# Patient Record
Sex: Female | Born: 1970 | Race: White | Hispanic: Yes | Marital: Married | Smoking: Never smoker
Health system: Southern US, Community
[De-identification: ages and names within clinical notes are randomized; demographics above are authoritative.]

## PROBLEM LIST (undated history)

## (undated) ENCOUNTER — Emergency Department (HOSPITAL_COMMUNITY): Admission: EM | Payer: Self-pay | Source: Home / Self Care

## (undated) DIAGNOSIS — I10 Essential (primary) hypertension: Secondary | ICD-10-CM

## (undated) DIAGNOSIS — K219 Gastro-esophageal reflux disease without esophagitis: Secondary | ICD-10-CM

## (undated) DIAGNOSIS — E7800 Pure hypercholesterolemia, unspecified: Secondary | ICD-10-CM

---

## 2002-01-17 ENCOUNTER — Inpatient Hospital Stay (HOSPITAL_COMMUNITY): Admission: AD | Admit: 2002-01-17 | Discharge: 2002-01-17 | Payer: Self-pay | Admitting: Obstetrics and Gynecology

## 2002-01-27 ENCOUNTER — Encounter: Admission: RE | Admit: 2002-01-27 | Discharge: 2002-01-27 | Payer: Self-pay | Admitting: Family Medicine

## 2003-04-06 ENCOUNTER — Other Ambulatory Visit: Admission: RE | Admit: 2003-04-06 | Discharge: 2003-04-06 | Payer: Self-pay | Admitting: *Deleted

## 2003-10-19 ENCOUNTER — Inpatient Hospital Stay (HOSPITAL_COMMUNITY): Admission: AD | Admit: 2003-10-19 | Discharge: 2003-10-23 | Payer: Self-pay | Admitting: *Deleted

## 2003-10-20 ENCOUNTER — Encounter (INDEPENDENT_AMBULATORY_CARE_PROVIDER_SITE_OTHER): Payer: Self-pay | Admitting: *Deleted

## 2003-12-22 ENCOUNTER — Other Ambulatory Visit: Admission: RE | Admit: 2003-12-22 | Discharge: 2003-12-22 | Payer: Self-pay | Admitting: *Deleted

## 2006-11-16 ENCOUNTER — Emergency Department (HOSPITAL_COMMUNITY): Admission: EM | Admit: 2006-11-16 | Discharge: 2006-11-16 | Payer: Self-pay | Admitting: Emergency Medicine

## 2009-03-16 ENCOUNTER — Other Ambulatory Visit: Admission: RE | Admit: 2009-03-16 | Discharge: 2009-03-16 | Payer: Self-pay | Admitting: Gynecology

## 2009-03-16 ENCOUNTER — Ambulatory Visit: Payer: Self-pay | Admitting: Gynecology

## 2009-03-16 ENCOUNTER — Encounter: Payer: Self-pay | Admitting: Gynecology

## 2009-03-18 ENCOUNTER — Ambulatory Visit: Payer: Self-pay | Admitting: Gynecology

## 2010-09-24 ENCOUNTER — Emergency Department (HOSPITAL_COMMUNITY)
Admission: EM | Admit: 2010-09-24 | Discharge: 2010-09-24 | Disposition: A | Discharge status: Home / Self Care | Payer: Self-pay | Attending: Emergency Medicine | Admitting: Emergency Medicine

## 2010-09-24 ENCOUNTER — Emergency Department (HOSPITAL_COMMUNITY): Payer: Self-pay

## 2010-09-24 DIAGNOSIS — R059 Cough, unspecified: Secondary | ICD-10-CM | POA: Insufficient documentation

## 2010-09-24 DIAGNOSIS — IMO0001 Reserved for inherently not codable concepts without codable children: Secondary | ICD-10-CM | POA: Insufficient documentation

## 2010-09-24 DIAGNOSIS — J3489 Other specified disorders of nose and nasal sinuses: Secondary | ICD-10-CM | POA: Insufficient documentation

## 2010-09-24 DIAGNOSIS — J069 Acute upper respiratory infection, unspecified: Secondary | ICD-10-CM | POA: Insufficient documentation

## 2010-09-24 DIAGNOSIS — R509 Fever, unspecified: Secondary | ICD-10-CM | POA: Insufficient documentation

## 2010-09-24 DIAGNOSIS — R05 Cough: Secondary | ICD-10-CM | POA: Insufficient documentation

## 2011-01-06 NOTE — Discharge Summary (Signed)
Jill Conrad, Jill Conrad                     ACCOUNT NO.:  [id number]   MEDICAL RECORD NO.:  [id number]                   PATIENT TYPE:  INP   LOCATION:  9135                                 FACILITY:  [facility]   PHYSICIAN:  Gerri Spore B. Earlene Plater, M.D.               DATE OF BIRTH:  June 05, 1971   DATE OF ADMISSION:  10/19/2003  DATE OF DISCHARGE:  10/23/2003                                 DISCHARGE SUMMARY   ADMISSION DIAGNOSES:  1. Forty-week intrauterine pregnancy.  2. Mild preeclampsia.  3. Gestational diabetes.   DISCHARGE DIAGNOSES:  1. Forty-week intrauterine pregnancy.  2. Mild preeclampsia.  3. Gestational diabetes.   PROCEDURES:  1. Admission for induction of labor.  2. Primary low transverse cesarean section for intermittently-repetitive     late decelerations and meconium-stained fluid.   HISTORY OF PRESENT ILLNESS:  A 40 year old Hispanic female gravida 1 para 0  at 40+ weeks noted to have blood pressures in the 140s over 90s range the  week prior to admission.  A 24-hour urine on the day prior to admission 350  mg.  Given her gestational age the patient is admitted for induction of  labor.   HOSPITAL COURSE:  The patient was admitted and cervix ripened with Cervidil  overnight and Pitocin started in the morning.  She was given penicillin for  group B strep prophylaxis.  Of note, she was found to have a slightly  elevated glucose on preeclampsia labs and these were followed in the 120  range (her Glucola had been normal).  However, given the persistently-  elevated blood sugars an insulin drip was started.  She only required about  1 unit/hour to control glucose.   The patient's membranes were ruptured and she was about 1 cm, noted to have  intermittent late decelerations.  IUPC was inserted and the resting tone was  noted to be elevated despite proper functioning of the IUPC and proper  zeroing.  The uterus felt tight between contractions and therefore the  Pitocin was  cut in half.   Subsequently the uterine contractions were very inadequate but resting tone  was normal and was noted again to have intermittent late decelerations  without cervical change.  Given the history of elevated resting tone,  intermittent late decelerations, and remoteness from delivery I recommended  cesarean section as the possibility of chorioamnionitis or abruption was  present.  The patient was in agreement.   She was subsequently delivered via primary low transverse cesarean section a  viable female, Apgars 7 and 9, 7 pounds 5 ounces, with cord pH 7.27.  Placenta  appeared normal and was submitted to pathology.   Postoperatively the patient rapidly regained her ability to ambulate, void,  and tolerate a regular diet.  Her blood pressure was normalized after  delivery and she was discharged home on postoperative day #3 in satisfactory  condition.   DISCHARGE INSTRUCTIONS:  Standard preprinted instructions given prior to  dismissal.   DISCHARGE MEDICATIONS:  1. Tylox one to two p.o. q.4-6h. p.r.n. pain.  2. Motrin 600 mg p.o. q.8h. p.r.n. pain.   FOLLOW-UP:  [facility], Dr. Earlene Plater, in 4 weeks.   Of note, the final pathology on the placenta showed acute chorioamnionitis  and meconium staining of the fetal membranes and numerous subchorionic and  intervillous thrombi and partially-torn umbilical cord at the insertion site  with associated hematoma.   DISPOSITION AT DISCHARGE:  Satisfactory.                                               Gerri Spore B. Earlene Plater, M.D.    WBD/MEDQ  D:  11/16/2003  T:  11/16/2003  Job:  [id number]

## 2011-01-06 NOTE — Op Note (Signed)
Jill Conrad, Jill Conrad                     ACCOUNT NO.:  [id number]   MEDICAL RECORD NO.:  [id number]                   PATIENT TYPE:  INP   LOCATION:  9164                                 [facility]ILITY[facility]  [facility]   PHYSICIAN:  Gerri Spore B. Earlene Plater, M.D.               DATE OF BIRTH:  01/16/1971   DATE OF PROCEDURE:  10/20/2003  DATE OF DISCHARGE:                                 OPERATIVE REPORT   PREOPERATIVE DIAGNOSES:  1. Mild preeclampsia.  2. Gestational diabetes.  3. Intermittently repetitive late decelerations.  4. Meconium-stained fluid.   POSTOPERATIVE DIAGNOSES:  1. Mild preeclampsia.  2. Gestational diabetes.  3. Intermittently repetitive late decelerations.  4. Meconium-stained fluid.   PROCEDURE:  Primary low transverse cesarean section.   SURGEON:  Chester Holstein. Earlene Plater, M.D.   ANESTHESIA:  Spinal.   FINDINGS:  A viable female infant, Apgars 7 and 9, weight 7 pounds 5 ounces.  Normal-appearing uterus, tubes, and ovaries.  Arterial cord pH 7.27.   ESTIMATED BLOOD LOSS:  750.   FLUIDS REPLACED:  2000.   URINE OUTPUT:  300.   COMPLICATIONS:  None.   INDICATIONS:  The patient was being induced for the above issues.  She was 1  cm, ruptured, with an IUPC in place.  She had an elevated resting tone and  intermittent late decelerations, concerning for possible early abruption.  When Pitocin was decreased, the baseline improved; however, there were  minimally discernible contractions over baseline at that point.  Given that  at higher doses of Pitocin she had an elevated resting tone and at lower  doses inadequate contractions and intermittent late decelerations remote  from delivery, I recommended we proceed with a cesarean section.  Operative  risks were discussed in Spanish to the patient and her husband, including  infection, bleeding, damage to bowel or bladder or surrounding organs, all  questions answered.  The patient wished to proceed.   DESCRIPTION OF PROCEDURE:  The  patient was taken to the operating room and  spinal anesthesia obtained.  She was placed in the supine position with a  leftward tilt, patient in standard fashion, and a Foley catheter inserted in  the bladder.  A Pfannenstiel incision was made.  The fascia was divided  sharply.  The posterior sheath of the peritoneum was entered sharply.  A  bladder blade inserted and bladder flap created with sharp and blunt  technique.   The uterine incision made in low transverse fashion with a knife.  Slightly  meconium-tinged fluid noted at entry into the cavity.  The incision was  extended laterally with bandage scissors.  The infant's head was delivered  through the incision without difficulty and found to be left occiput  transverse.  The nose and mouth were suctioned with the DeLee wall suction,  the remainder of the infant delivered without difficulty, the cord clamped  and cut, and the infant handed off to the waiting pediatricians.  The  patient was already receiving penicillin for group B strep prophylaxis;  therefore, no additional antibiotics were given.  Arterial cord pH was  obtained.   The placenta was removed manually and no evidence clinically of abruption  was seen.  There was meconium-stained fluid.  The membranes were meconium-  stained.  The placenta was submitted to pathology.   The uterus was exteriorized and cleared of all clots and debris.  The  uterine incision was inspected and was free of extension.  The right tube  and ovary appeared normal.  The left tube and ovary were somewhat obscured  by adhesions, and it was unclear if there was actually a left tube or ovary  present.  The patient does not report any history of previous surgery.   The uterus was closed in a running locked fashion with 0 chromic.  A second  imbricating layer was placed of the same suture.  Hemostasis obtained.  The  uterus was returned to the abdomen and the pelvis irrigated.  The uterine   incision was inspected and was hemostatic.  The bladder flap was hemostatic,  as was the subfascial space.   The fascia was closed in a running stitch of 0 Vicryl.  The subcutaneous  tissue was irrigated and was hemostatic.  It was reapproximated with  interrupted plain 0 suture.  The skin was closed with staples.   The patient tolerated the procedure well.  There were no complications.  She  was taken to the recovery room awake, alert, and in stable condition, all  counts correct per the operating room staff.  The pediatrician was made  aware of the patient's elevated blood sugars, which were treated with an  insulin drip during labor.                                               Gerri Spore B. Earlene Plater, M.D.    WBD/MEDQ  D:  10/20/2003  T:  10/20/2003  Job:  [id number]

## 2014-05-08 ENCOUNTER — Emergency Department (HOSPITAL_COMMUNITY): Payer: Self-pay

## 2014-05-08 ENCOUNTER — Emergency Department (HOSPITAL_COMMUNITY)
Admission: EM | Admit: 2014-05-08 | Discharge: 2014-05-08 | Disposition: A | Discharge status: Home / Self Care | Payer: Self-pay | Attending: Emergency Medicine | Admitting: Emergency Medicine

## 2014-05-08 ENCOUNTER — Encounter (HOSPITAL_COMMUNITY): Payer: Self-pay | Admitting: Emergency Medicine

## 2014-05-08 DIAGNOSIS — M25562 Pain in left knee: Secondary | ICD-10-CM

## 2014-05-08 DIAGNOSIS — G8911 Acute pain due to trauma: Secondary | ICD-10-CM | POA: Insufficient documentation

## 2014-05-08 DIAGNOSIS — Z79899 Other long term (current) drug therapy: Secondary | ICD-10-CM | POA: Insufficient documentation

## 2014-05-08 DIAGNOSIS — M25569 Pain in unspecified knee: Secondary | ICD-10-CM | POA: Insufficient documentation

## 2014-05-08 MED ORDER — ACETAMINOPHEN 325 MG PO TABS
650.0000 mg | ORAL_TABLET | Freq: Four times a day (QID) | ORAL | Status: DC | PRN
Start: 2014-05-08 — End: 2020-11-04

## 2014-05-08 MED ORDER — HYDROCODONE-ACETAMINOPHEN 5-325 MG PO TABS
1.0000 | ORAL_TABLET | Freq: Once | ORAL | Status: AC
  Administered 2014-05-08: 1 via ORAL
  Filled 2014-05-08: qty 1

## 2014-05-08 MED ORDER — MELOXICAM 15 MG PO TABS: 15.0000 mg | ORAL_TABLET | Freq: Every day | ORAL | Status: DC

## 2014-05-08 NOTE — ED Provider Notes (Signed)
CSN: [id number]     Arrival date & time 05/08/14  0804 History   First MD Initiated Contact with Patient 05/08/14 0825     Chief Complaint  Patient presents with  . Leg Pain     (Consider location/radiation/quality/duration/timing/severity/associated sxs/prior Treatment) HPI Comments: Patient reports left knee pain x1 month.  Pain began after stepping in a hole causing her to twist and fall on her left knee.  She had swelling of the knee and immediate afterwards, and was evaluated by a physician who started [facility] and recommended rest.  She has continued to take [facility] for the last month with minimal relief; swelling has improved but not resolved.  She denies any previous knee injuries or surgeries.  Pain is greatest on the medial aspect and made worse with activity/walking and twisting.  She denies any locking or giving out.  She's not taking any blood thinning medication.   Patient is a 43 y.o. female presenting with leg pain.  Leg Pain Location:  Knee Time since incident:  1 month Injury: yes   Mechanism of injury: fall   Fall:    Fall occurred:  Walking   Impact surface:  Designer, fashion/clothing of impact:  Knees   Entrapped after fall: no   Knee location:  L knee Pain details:    Quality:  Aching and sharp   Radiates to:  Does not radiate   Severity:  Severe   Duration:  1 month   Timing:  Intermittent   Progression:  Unchanged Dislocation: no   Prior injury to area:  No Relieved by:  Nothing Worsened by:  Activity, flexion, rotation and bearing weight Ineffective treatments:  NSAIDs and rest Associated symptoms: decreased ROM, stiffness and swelling   Associated symptoms: no back pain, no fever and no muscle weakness   Risk factors: no concern for non-accidental trauma and no frequent fractures     History reviewed. No pertinent past medical history. History reviewed. No pertinent past surgical history. No family history on file. History  Substance Use Topics  . Smoking status:  Never Smoker   . Smokeless tobacco: Not on file  . Alcohol Use: No   OB History   Grav Para Term Preterm Abortions TAB SAB Ect Mult Living                 Review of Systems  Constitutional: Negative for fever.  Musculoskeletal: Positive for stiffness. Negative for back pain.      Allergies  Review of patient's allergies indicates no known allergies.  Home Medications   Prior to Admission medications   Medication Sig Start Date End Date Taking? Authorizing Provider  Camphor-Menthol-Methyl Sal (MUSCLE RUB) 11-28-28 % CREA Apply 1 application topically 2 (two) times daily as needed (Knee pain).   Yes Historical Provider, MD  lisinopril-hydrochlorothiazide (PRINZIDE,ZESTORETIC) 20-25 MG per tablet Take 1 tablet by mouth daily.   Yes Historical Provider, MD  acetaminophen (TYLENOL) 325 MG tablet Take 2 tablets (650 mg total) by mouth every 6 (six) hours as needed for moderate pain. 05/08/14   Jamal Collin, MD  meloxicam (MOBIC) 15 MG tablet Take 1 tablet (15 mg total) by mouth daily. 05/08/14   Jamal Collin, MD   BP 109/55  Pulse 66  Temp(Src) 98.1 F (36.7 C) (Oral)  Resp 16  SpO2 100% Physical Exam  Constitutional: She appears well-developed. No distress.  Cardiovascular: Normal rate and regular rhythm.   Pulmonary/Chest: Effort normal and breath sounds normal.  Musculoskeletal:  Knee: Inspection: Mild swelling with no erythema or obvious bony abnormalities.  Palpation: Medial joint line tenderness; no warmth or patellar tenderness ROM: Limited; flexion 90; distention 170 Ligaments: Laxity noted valgus stress testing;  consistent endpoints with  ACL, PCL, LCL testing. Positive Mcmurray's, Apley's, and Thessalonian tests. Non painful patellar compression. Patellar glide without crepitus. Patellar and quadriceps tendons unremarkable. Hamstring and quadriceps strength 4/5 -limited by pain     ED Course  Procedures (including critical care time) Labs Review Labs  Reviewed - No data to display  Imaging Review Dg Knee Complete 4 Views Left  05/08/2014   CLINICAL DATA:  Left knee pain  EXAM: LEFT KNEE - COMPLETE 4+ VIEW  COMPARISON:  None.  FINDINGS: Very mild degenerative changes are noted. A small joint effusion is seen. No acute fracture dislocation is noted.  IMPRESSION: Mild degenerative change.  No acute abnormality is noted.   Electronically Signed   By: Alcide Clever M.D.   On: 05/08/2014 09:13     EKG Interpretation None      MDM   Final diagnoses:  Knee pain, left   Patient presents with left knee pain for one month after fall involving twisting left knee.  Swelling has improved, but pain persists despite Modic x1 month.  Physical exam notable for mild left knee effusion positive valgus stress testing, and positive Apley's, McMurray and Thessaly.  Suspect medial meniscus injury.  Remaining ligaments are intact.  Knee brace given, advised icing knee after activity.  Patient advised to followup with sports medicine for additional testing, and consideration of physical therapy or surgery. Increased Mobic to 15 mg qd.    Jamal Collin, MD 05/08/14 1017

## 2014-05-08 NOTE — ED Notes (Signed)
Brought pt back to room; pt instructed to get undressed and to place on gown; Cyndi, RN aware of pt

## 2014-05-08 NOTE — Discharge Instructions (Signed)
Hielo en la rodilla durante 15 -20 minutos despus de la [employer] Mobic a 15 mg una vez al [employer] Tylenol 650 mg hasta cuatro veces al da para el dolor adicional  Dolor en la rodilla (Knee Pain) La rodilla es la articulacin compleja entre el muslo y la parte inferior de la pierna. En esta articulacin hay huesos, tendones, ligamentos y TEFL teacher. Los huesos que forman la rodilla son:  El fmur en el muslo.  La tibia y el peron en la pierna.  La rtula montada en la ranura de la parte inferior del muslo. CAUSAS El dolor de rodilla es una causa frecuente de [place] y puede tener varias causas. Algunas son:  Lesiones como:  Ruptura de ligamento o lesin en el tendn.  Esguince del cartlago  Enfermedades como:  Gota.  Artritis.  Infecciones.  Uso excesivo, demasiado entrenamiento o mucha actividad fsica. El dolor de rodilla puede ser leve o intenso. Puede acompaar una lesin debilitante. Los problemas leves con frecuencia responden bien a tratamientos caseros o se mejoran por s mismas. Las lesiones ms graves pueden requerir la intervencin del mdico y [place]. SNTOMAS La rodilla es una articulacin compleja. Los sntomas pueden variar ampliamente Algunos son:  Dolor con el movimiento o al soportar peso.  Hinchazn y Engineer, mining.  Torsin de la rodilla.  Imposibilidad para estirar la rodilla.  La rodilla se traba y no puede enderezarla.  Siente calor y se observa enrojecimiento con dolor y [employer].  Deformidad o dislocacin de la rtula. DIAGNSTICO Determinar cual es el problema puede ser bastante simple, como cuando hay una lesin. Tambin puede ser [employer] debido a la complejidad de la rodilla. Las pruebas para Education officer, environmental un diagnstico son:  Marily Memos y examen fsico por parte del mdico.  Radiografas para descartar otros problemas. Las radiografas no mostrarn la ruptura del TEFL teacher. Algunas lesiones en la rodilla pueden  diagnosticarse del siguiente modo:  La artroscopia es una tcnica quirrgica por la que una pequea cmara de vdeo se inserta en pequeas incisiones que se hacen a los lados de la rodilla. Este procedimiento se [place] para examinar y Therapist, sports los problemas de la articulacin interna de la rodilla. Se utilizan pequeos instrumentos para reparar el cartlago roto (meniscos).  La artrografa es una tcnica radiolgica. Se inyecta un lquido de contraste en la articulacin de la rodilla. Las estructuras internas de la articulacin de la rodilla se hacen visibles en una pelcula de rayos X.  Las imgenes por resonancia magntica son un procedimiento en el que los campos magnticos y una computadora producen imgenes en dos o tres dimensiones del interior de la rodilla. La ruptura del cartlago es visible con esta tcnica. La resonancia magntica ha reemplazado a la artrografa en el diagnstico de la ruptura del cartlago de la rodilla.  Anlisis de [place].  Examen del lquido que lubrica la articulacin de la rodilla (lquido sinovial). Se realiza tomando [place] con [place]. TRATAMIENTO El tratamiento de los problemas de la rodilla depende fundamentalmente de la causa. Algunos de estos tratamientos son:  Segn sea la lesin, un yeso o entablillado, ciruga o fisioterapia.  Permtase el tiempo adecuado de recuperacin. No use demasiado su extremidad lesionada. Si siente dolor durante los ejercicios de rutina, suspndalos. Hgalos ms lentos o realice menos repeticiones.  En el caso de actividades repetitivas como andar en bicicleta o correr, mantenga la fuerza y Neomia Dear buena nutricin.  Alterne los grupos musculares. Por ejemplo, si levanta pesas, trabaje la  parte superior del cuerpo Civil engineer, contracting, y la parte inferior al da siguiente.  Ni los msculos firmes ni los dbiles proporcionan un sostn adecuado a la rodilla. Los msculos no absorben el estrs que se ejerce sobre la articulacin de  la rodilla. Mantenga fuertes los msculos que rodean a la rodilla.  Cudese de los problemas mecnicos:  Si tiene pie plano, los zapatos ortopdicos o especiales pueden ayudar. Comunquese con el profesional que lo asiste si necesita ayuda adicional.  Los soporte de arco con bordes en la zona interna o interna del taco pueden ayudar. Cambian la presin del lado de la rodilla ms comprometido por la osteoartritis.  Podrn colocarle una ortesis de rodilla para aliviar la presin en la zona ms artrtica de la rodilla.  Si el profesional le ha prescripto muletas, ortesis, un vendaje o hielo, hgalo segn las indicaciones. El acrnimo para este tratamiento es PRICE. Significa proteccin, reposo, hielo, compresin y elevacin.  Los antiinflamatorios no esteroides, pueden ayudar a Engineer, materials. Pero si se toman inmediatamente luego de la lesin, podran aumentar la hinchazn. Tome los corticoides luego de Clinical cytogeneticist. Suspndalos si tiene problemas estomacales. No los tome si tiene una historia de Occupational hygienist, Engineer, mining en el estmago o hemorragia intestinal. No lo tome sin la aprobacin del profesional que la asiste si tiene problemas de retencin de lquidos, insuficencia cardaca o problemas renales.  En los casos crnicos, la fisioterapia puede ser de [place].  La glucosamina y el condroitin son suplementos dietarios de Sales promotion account executive. Ambos pueden Engineer, materials de la osteoartritis de la rodilla. Estos medicamentos son diferentes de los antiinflamatorios habituales. La glucosamina puede disminuir el porcentaje de destruccin del cartlago.  Las inyecciones de corticoides en la articulacin de la rodilla reducen los sntomas de un brote de artritis. Ofrecen alivio que dura algunos meses. Hay que esperar algunos meses entre la aplicacin de inyecciones. Las inyecciones tiene un pequeo riesgo de infeccin, retencin de lquidos y Engineer, structural de los niveles de Production assistant, radio.  El cido hialurnico inyectado en  las articulaciones lesionadas puede aliviar el dolor y proporciona lubricacin. Estas inyecciones funcionan bien reduciendo la inflamacin. Una serie de inyecciones puede proporcionar alivio durante seis meses.  Analgsicos locales. Aplicar ciertos ungentos sobre la piel puede ayudar a Engineer, materials y la rigidez de la osteoartritis. Consulte con el farmacutico, si es necesario. Muchos medicamentos de venta libre estn aprobados para el alivio temporario del dolor artrtico.  En algunos pases los mdicos prescriben antiinflamatorios no esteroides para el alivio de los trastornos crnicos como la artritis y la tendinitis. Un estudio del tratamiento con antiinflamatorios no esteroides aplicados en crema, demostr que funcionaban bien, as como administrados por va oral, pero sin el peligro de los [place]. PREVENCIN  Mantenga un peso normal. Los kilos de ms agregan tensin a las articulaciones.  Mantngase fuerte y gil. Los msculos dbiles son Neomia Dear causa frecuente de lesiones en la rodilla. La elongacin es importante. Incluya ejercicios de flexibilidad en sus rutinas.  Practique actividad fsica con inteligencia. Si sufre osteoartritis, dolor crnico en la rodilla o lesiones recurrentes, podr ser necesario que modifique el modo en que se ejercita. No significa que deba volverse inactivo. Si le duelen las rodillas despus de correr o jugar basketball, considere la prctica de la natacin, ejercicios aerbicos en el agua u otras actividades de bajo [place], al menos durante [address] o [employer]. En algunos casos, el [employer] [address] de alto impacto ofrece [place].  Asegrese que  sus zapatos le [employer]. Elija el calzado deportivo adecuado para su deporte.  Proteja sus rodillas. Use la proteccin adecuada para las actividades que puedan afectar a sus rodillas. Use rodilleras cuando juegue al vley o se arrodille. Colquese el cinturn de seguridad cada vez que  conduzca. La mayor parte de las fracturas de rtula ocurren en accidentes automvilsticos.  Descanse cuando se sienta cansado. SOLICITE ATENCIN MDICA SI: Tiene dolor en la rodilla que es continuo y no parece mejorar.  SOLICITE ATENCIN MDICA DE INMEDIATO SI:  La articulacin de la rodilla se siente caliente al tacto y usted tiene fiebre. EST SEGURO QUE:   Comprende las instrucciones para el alta mdica.  Controlar su enfermedad.  Solicitar atencin mdica de inmediato segn las indicaciones. Document Released: 01/24/2008 Document Revised: 10/30/2011 [facility] Patient Information 2015 [place], [place]. This information is not intended to replace advice given to you by your health care provider. Make sure you discuss any questions you have with your health care provider.

## 2014-05-08 NOTE — ED Notes (Signed)
Left leg and knee pain x 1 month  Works in [employer] in front of knee denies injury

## 2014-05-13 NOTE — ED Provider Notes (Signed)
43 y.o female with right knee pain.  PE no evidence of fracture dislocation ischemia or ligamentous disruption Dg Knee Complete 4 Views Left  05/08/2014   CLINICAL DATA:  Left knee pain  EXAM: LEFT KNEE - COMPLETE 4+ VIEW  COMPARISON:  None.  FINDINGS: Very mild degenerative changes are noted. A small joint effusion is seen. No acute fracture dislocation is noted.  IMPRESSION: Mild degenerative change.  No acute abnormality is noted.   Electronically Signed   By: Alcide Clever M.D.   On: 05/08/2014 09:13   I saw and evaluated the patient, reviewed the resident's note and I agree with the findings and plan.   EKG Interpretation None        Hilario Quarry, MD 05/13/14 1444

## 2014-05-22 ENCOUNTER — Ambulatory Visit: Payer: Self-pay | Admitting: Family Medicine

## 2015-07-25 ENCOUNTER — Emergency Department (HOSPITAL_COMMUNITY)
Admission: EM | Admit: 2015-07-25 | Discharge: 2015-07-25 | Disposition: A | Discharge status: Home / Self Care | Payer: Self-pay | Attending: Emergency Medicine | Admitting: Emergency Medicine

## 2015-07-25 ENCOUNTER — Encounter (HOSPITAL_COMMUNITY): Payer: Self-pay | Admitting: *Deleted

## 2015-07-25 ENCOUNTER — Emergency Department (HOSPITAL_COMMUNITY): Payer: Self-pay

## 2015-07-25 DIAGNOSIS — R002 Palpitations: Secondary | ICD-10-CM | POA: Insufficient documentation

## 2015-07-25 DIAGNOSIS — Z791 Long term (current) use of non-steroidal anti-inflammatories (NSAID): Secondary | ICD-10-CM | POA: Insufficient documentation

## 2015-07-25 DIAGNOSIS — I10 Essential (primary) hypertension: Secondary | ICD-10-CM | POA: Insufficient documentation

## 2015-07-25 DIAGNOSIS — Z79899 Other long term (current) drug therapy: Secondary | ICD-10-CM | POA: Insufficient documentation

## 2015-07-25 DIAGNOSIS — E663 Overweight: Secondary | ICD-10-CM | POA: Insufficient documentation

## 2015-07-25 HISTORY — DX: Essential (primary) hypertension: I10

## 2015-07-25 HISTORY — DX: Pure hypercholesterolemia, unspecified: E78.00

## 2015-07-25 LAB — BASIC METABOLIC PANEL
Anion gap: 9 (ref 5–15)
BUN: 13 mg/dL (ref 6–20)
CALCIUM: 9.9 mg/dL (ref 8.9–10.3)
CO2: 25 mmol/L (ref 22–32)
Chloride: 101 mmol/L (ref 101–111)
Creatinine, Ser: 0.61 mg/dL (ref 0.44–1.00)
GFR calc Af Amer: >60 mL/min (ref >60)
GLUCOSE: 100 mg/dL — AB (ref 65–99)
Potassium: 3.9 mmol/L (ref 3.5–5.1)
Sodium: 135 mmol/L (ref 135–145)

## 2015-07-25 LAB — CBC
HEMATOCRIT: 38.8 % (ref 36.0–46.0)
Hemoglobin: 12.8 g/dL (ref 12.0–15.0)
MCH: 27.9 pg (ref 26.0–34.0)
MCHC: 33 g/dL (ref 30.0–36.0)
MCV: 84.7 fL (ref 78.0–100.0)
Platelets: 373 10*3/uL (ref 150–400)
RBC: 4.58 MIL/uL (ref 3.87–5.11)
RDW: 13.4 % (ref 11.5–15.5)
WBC: 8.1 10*3/uL (ref 4.0–10.5)

## 2015-07-25 LAB — I-STAT TROPONIN, ED: TROPONIN I, POC: 0.02 ng/mL (ref 0.00–0.08)

## 2015-07-25 NOTE — ED Notes (Signed)
Patient transported to X-ray 

## 2015-07-25 NOTE — ED Provider Notes (Signed)
CSN: [id number]     Arrival date & time 07/25/15  1155 History   First MD Initiated Contact with Patient 07/25/15 1223     Chief Complaint  Patient presents with  . Palpitations     (Consider location/radiation/quality/duration/timing/severity/associated sxs/prior Treatment) HPI Jill Conrad is a 44 y.o. female who is Spanish-speaking, history obtained via interpreter. 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 Cannot further clarify. She reports she stands for long periods of time. She denies any fevers, chest pain, shortness of breath, numbness or weakness. No leg swelling, hemoptysis, exogenous estrogen use, history of blood clot. She also reports sometimes feeling anxious and "crying for no reason". She denies any discomfort whatsoever in the ED now. She reports she has a history of hypertension and cholesterol. No family history of heart problems. Nonsmoker. Reports her PCP is Dr. Mayford Knife and she will be able to follow up with him next week.  Past Medical History  Diagnosis Date  . Hypertension   . High cholesterol    History reviewed. No pertinent past surgical history. History reviewed. No pertinent family history. Social History  Substance Use Topics  . Smoking status: Never Smoker   . Smokeless tobacco: None  . Alcohol Use: No   OB History    No data available     Review of Systems A 10 point review of systems was completed and was negative except for pertinent positives and negatives as mentioned in the history of present illness     Allergies  Review of patient's allergies indicates no known allergies.  Home Medications   Prior to Admission medications   Medication Sig Start Date End Date Taking? Authorizing Provider  acetaminophen (TYLENOL) 325 MG tablet Take 2 tablets (650 mg total) by mouth every 6 (six) hours as needed for moderate pain.  05/08/14   Jamal Collin, MD  Camphor-Menthol-Methyl Sal (MUSCLE RUB) 11-28-28 % CREA Apply 1 application topically 2 (two) times daily as needed (Knee pain).    Historical Provider, MD  lisinopril-hydrochlorothiazide (PRINZIDE,ZESTORETIC) 20-25 MG per tablet Take 1 tablet by mouth daily.    Historical Provider, MD  meloxicam (MOBIC) 15 MG tablet Take 1 tablet (15 mg total) by mouth daily. 05/08/14   Jamal Collin, MD   BP 120/72 mmHg  Pulse 69  Temp(Src) 98 F (36.7 C) (Oral)  Resp 16  SpO2 99%  LMP 07/12/2015 Physical Exam  Constitutional: She is oriented to person, place, and time. She appears well-developed and well-nourished.  Overweight, Latin American female  HENT:  Head: Normocephalic and atraumatic.  Mouth/Throat: Oropharynx is clear and moist.  Eyes: Conjunctivae are normal. Pupils are equal, round, and reactive to light. Right eye exhibits no discharge. Left eye exhibits no discharge. No scleral icterus.  Neck: Normal range of motion. Neck supple.  Cardiovascular: Normal rate, regular rhythm and normal heart sounds.   Pulmonary/Chest: Effort normal and breath sounds normal. No respiratory distress. She has no wheezes. She has no rales.  Abdominal: Soft. There is no tenderness.  Musculoskeletal: Normal range of motion. She exhibits no edema or tenderness.  Neurological: She is alert and oriented to person, place, and time.  Cranial Nerves II-XII grossly intact  Skin: Skin is warm and dry. No rash noted.  Psychiatric: She has a normal mood and affect.  Nursing note and vitals reviewed.   ED Course  Procedures (including critical care  time) Labs Review Labs Reviewed  BASIC METABOLIC PANEL - Abnormal; Notable for the following:    Glucose, Bld 100 (*)    All other components within normal limits  CBC  I-STAT TROPOININ, ED    Imaging Review Dg Chest 2 View  07/25/2015  CLINICAL DATA:  Shortness of breath and palpitations. EXAM: CHEST  2 VIEW COMPARISON:  Chest x-ray  dated 09/24/2010. FINDINGS: The heart size and mediastinal contours are within normal limits. Both lungs are clear. No pleural effusion seen. No pneumothorax seen. Mild wedge compression deformities within the mid thoracic spine are stable. No acute osseous abnormality seen. IMPRESSION: Stable chest x-ray. No evidence of acute cardiopulmonary abnormality. Electronically Signed   By: Bary RichardStan  Maynard M.D.   On: 07/25/2015 13:27   I have personally reviewed and evaluated these images and lab results as part of my medical decision-making.   EKG Interpretation   Date/Time:  Sunday July 25 2015 12:14:04 EST Ventricular Rate:  82 PR Interval:  148 QRS Duration: 78 QT Interval:  372 QTC Calculation: 434 R Axis:   80 Text Interpretation:  Normal sinus rhythm Normal ECG Confirmed by DELO   MD, DOUGLAS ([id number]) on 07/25/2015 1:43:01 PM     Meds given in ED:  Medications - No data to display  Discharge Medication List as of 07/25/2015  2:04 PM     Filed Vitals:   07/25/15 1213 07/25/15 1411  BP: 146/79 120/72  Pulse: 85 69  Temp: 98 F (36.7 C)   TempSrc: Oral   Resp: 18 16  SpO2: 100% 99%    MDM  Vitals stable - WNL -afebrile Pt resting comfortably in ED. No chest discomfort in ED PE--Lung exam unremarkable. Cardiac auscultation reveals no murmurs rubs or gallops. Grossly Benign Physical Exam Labwork: Troponin negative. EKG reassuring.  Labs otherwise noncontributory Imaging: CXR shows no acute cardiopulmonary pathology.   DDX: Patient presents with atypical symptoms of "fluttering and chest swelling"  and clinical picture and exam today not consistent with ACS/dissection. Heart score 1. No evidence of spontaneous pneumothorax, esophageal rupture or other mediastinitis. PERC negative, doubt PE. No evidence of myocarditis, endocarditis, pericarditis.  Suspicion for possible vagal etiology. Discussed patient is to follow up with PCP for further valuation management of symptoms. Also  given referral to cardiology for outpatient workup. I discussed all relevant lab findings and imaging results with pt and they verbalized understanding. Discussed f/u with PCP within 48 hrs and return precautions, pt very amenable to plan.  Final diagnoses:  Palpitations      Joycie PeekBenjamin Omaya Nieland, PA-C 07/25/15 1920  Geoffery Lyonsouglas Delo, MD 07/27/15 [phone]

## 2015-07-25 NOTE — ED Notes (Signed)
Used translator phones, pt reports not feeling well for extended amount of time.  Reports that it "feels like her heart is swelling" and having palpitations. Pt reports feeling anxious and "crying for no reason." HR 85 at triage and no acute distress noted.

## 2015-07-25 NOTE — Discharge Instructions (Signed)
Please follow-up with your doctor or cardiology next week for reevaluation of your symptoms. There does not appear to be an emergent cause for your symptoms at this time. Your ED evaluation was reassuring. Return to ED for any new or worsening symptoms.  Palpitaciones (Palpitations) Es la sensacin de sentir que el latido cardaco es irregular o es ms rpido que lo normal. Se siente como un aleteo o que falta un latido. Generalmente no es un problema grave. Sin embargo, en algunos casos podra ser necesario hacer ms estudios diagnsticos. CAUSAS  Las causas de las palpitaciones pueden ser:  Rosalva FerronFumar.  El consumo de cafena u otros estimulantes, como pastillas para Geophysical data processoradelgazar o bebidas energizantes.  Alcohol.  Situaciones de estrs y [place]ansiedad.  La actividad fsica extenuante.  Fatiga.  Algunos medicamentos.  Enfermedad cardaca, especialmente si tiene antecedentes de ritmo cardaco irregular (arritmia), como fibrilacin auricular, aleteo auricular o taquicardia supraventricular.  El uso incorrecto de un marcapasos o Biochemist, clinicaldesfibrilador. DIAGNSTICO  Para hallar la causa de las palpitaciones, el mdico le har una historia clnica y un examen fsico. El mdico tambin puede hacerle un estudio llamado electrocardiograma (ECG) ambulatorio. El ECG registra el patrn de los latidos cardacos durante un perodo de 24horas. Tambin pueden hacerle otros estudios, por ejemplo:  Ecocardiograma transtorcico (ETT). Durante Management consultantel ecocardiograma, se usan ondas sonoras para evaluar cmo fluye la sangre por el corazn.  Ecocardiograma transesofgico (ETE).  Monitoreo cardaco. Este estudio permite que el mdico controle la frecuencia y el ritmo cardaco en tiempo real.  Monitor Holter. Es un dispositivo porttil que eBayregistra los latidos cardacos y [place] Grenadinesayuda a Education administratordiagnosticar las arritmias cardacas. Le permite al American Expressmdico registrar la actividad cardaca durante varios das, si es necesario.  Pruebas de estrs por  ejercicio o por medicamentos que aceleran los latidos cardacos. TRATAMIENTO  El tratamiento de las palpitaciones depende de la causa y puede variar mucho. En la International Business Machinesmayora de los casos no se requiere otro tratamiento que esperar, Lexicographerrelajarse y Pharmacologistel controlar los sntomas. Otras causas, como la fibrilacin auricular, el aleteo auricular o la taquicardia supraventricular generalmente requieren Pharmacist, communityun tratamiento. INSTRUCCIONES PARA EL CUIDADO EN EL HOGAR   Evite:  Bebidas que contengan cafena como el caf, el t, los refrescos, las pastillas para Geophysical data processoradelgazar y las bebidas energizantes.  Chocolate.  Alcohol.  Si fuma, abandone el hbito.  Reduzca los niveles de estrs y [place]ansiedad. Algunas cosas que pueden ayudarlo a relajarse son:  Un mtodo para controlar el cuerpo con la mente, por ejemplo, controlar los latidos (biorregulacin).  El yoga.  La meditacin.  La actividad fsica como natacin, trote o caminatas.  Descanse y duerma lo suficiente. SOLICITE ATENCIN MDICA SI:   Contina con latidos cardacos rpidos o irregulares durante ms de 24 horas.  Las Smith Internationalpalpitaciones le suceden con ms frecuencia. SOLICITE ATENCIN MDICA DE INMEDIATO SI:  Siente falta de aire o dolor en el pecho.  Sufre un dolor intenso de Turkmenistancabeza.  Se siente mareado o se desmaya. ASEGRESE DE QUE:  Comprende estas instrucciones.  Controlar su afeccin.  Recibir ayuda de inmediato si no mejora o si empeora.   Esta informacin no tiene Theme park managercomo fin reemplazar el consejo del mdico. Asegrese de hacerle al mdico cualquier pregunta que tenga.   Document Released: 05/17/2005 Document Revised: 08/12/2013 Elsevier Interactive Patient Education [employer]2016 Elsevier Inc.

## 2020-11-04 ENCOUNTER — Emergency Department (HOSPITAL_COMMUNITY): Payer: Self-pay

## 2020-11-04 ENCOUNTER — Encounter (HOSPITAL_COMMUNITY): Payer: Self-pay

## 2020-11-04 ENCOUNTER — Emergency Department (HOSPITAL_COMMUNITY)
Admission: EM | Admit: 2020-11-04 | Discharge: 2020-11-04 | Disposition: A | Discharge status: Home / Self Care | Payer: Self-pay | Attending: Emergency Medicine | Admitting: Emergency Medicine

## 2020-11-04 DIAGNOSIS — I10 Essential (primary) hypertension: Secondary | ICD-10-CM | POA: Insufficient documentation

## 2020-11-04 DIAGNOSIS — R0789 Other chest pain: Secondary | ICD-10-CM

## 2020-11-04 DIAGNOSIS — R55 Syncope and collapse: Secondary | ICD-10-CM

## 2020-11-04 DIAGNOSIS — R42 Dizziness and giddiness: Secondary | ICD-10-CM | POA: Insufficient documentation

## 2020-11-04 DIAGNOSIS — Z79899 Other long term (current) drug therapy: Secondary | ICD-10-CM | POA: Insufficient documentation

## 2020-11-04 LAB — BASIC METABOLIC PANEL
Anion gap: 7 (ref 5–15)
BUN: 11 mg/dL (ref 6–20)
CO2: 26 mmol/L (ref 22–32)
Calcium: 9.3 mg/dL (ref 8.9–10.3)
Chloride: 106 mmol/L (ref 98–111)
Creatinine, Ser: 0.5 mg/dL (ref 0.44–1.00)
GFR, Estimated: >60 mL/min (ref >60)
Glucose, Bld: 108 mg/dL — ABNORMAL HIGH (ref 70–99)
Potassium: 3.7 mmol/L (ref 3.5–5.1)
Sodium: 139 mmol/L (ref 135–145)

## 2020-11-04 LAB — CBC
HCT: 37 % (ref 36.0–46.0)
Hemoglobin: 12.3 g/dL (ref 12.0–15.0)
MCH: 28.3 pg (ref 26.0–34.0)
MCHC: 33.2 g/dL (ref 30.0–36.0)
MCV: 85.3 fL (ref 80.0–100.0)
Platelets: 336 10*3/uL (ref 150–400)
RBC: 4.34 MIL/uL (ref 3.87–5.11)
RDW: 13.4 % (ref 11.5–15.5)
WBC: 8.1 10*3/uL (ref 4.0–10.5)
nRBC: 0 % (ref 0.0–0.2)

## 2020-11-04 LAB — TROPONIN I (HIGH SENSITIVITY)
Troponin I (High Sensitivity): 3 ng/L (ref <18)
Troponin I (High Sensitivity): 4 ng/L (ref <18)

## 2020-11-04 LAB — I-STAT BETA HCG BLOOD, ED (MC, WL, AP ONLY): I-stat hCG, quantitative: <5 m[IU]/mL (ref <5)

## 2020-11-04 MED ORDER — OMEPRAZOLE 20 MG PO CPDR: 20.0000 mg | DELAYED_RELEASE_CAPSULE | Freq: Every day | ORAL | 0 refills | Status: DC

## 2020-11-04 NOTE — ED Triage Notes (Signed)
Pt BIB EMS from work.Pt was walking down step where she had near syncopal episode. Pt started c/o chest pain, was diaphoretic and dizzy

## 2020-11-04 NOTE — ED Provider Notes (Signed)
[facility] EMERGENCY DEPARTMENT Provider Note   CSN: [id number] Arrival date & time: 11/04/20  1100     History Chief Complaint  Patient presents with  . Near Syncope    Jill Conrad is a 50 y.o. female.  The history is provided by the patient. The history is limited by a language barrier. A language interpreter was used.  Near Syncope     50 year old hispanic female with history of hypertension, hypercholesterolemia, brought here via EMS from work for evaluation of near syncope history obtained through language interpreter.  Patient report for the past 3 days he has been experiencing intermittent episodes of lightheadedness accompanied with pain in her chest, feeling cold chills, shakiness, and having heart palpitation.  These episodes usually lasting for 10 to 15 minutes and brought on sporadically not dysuria associated with exertion.  She reports she has noticed similar episodes usually start in the morning and happen several times daily.  She has had similar symptoms like this in the past.  She was told by her coworker to eat chocolate or eating some food which sometimes does help but not always.  Due to recurrence of her symptoms she decided come here for evaluation.  She denies any fever, productive cough, exertional chest pain, shortness of breath, nausea vomiting diarrhea or dysuria.  She denies alcohol or tobacco use.  She does report that her mom has history of heart disease.  She denies any increased use of caffeine or energy drink.  She denies any active chest pain at this time.  She does have a PCP.  Past Medical History:  Diagnosis Date  . High cholesterol   . Hypertension     There are no problems to display for this patient.   History reviewed. No pertinent surgical history.   OB History   No obstetric history on file.     History reviewed. No pertinent family history.  Social History   Tobacco Use  . Smoking status: Never Smoker   . Smokeless tobacco: Never Used  Substance Use Topics  . Alcohol use: No  . Drug use: No    Home Medications Prior to Admission medications   Medication Sig Start Date End Date Taking? Authorizing Provider  acetaminophen (TYLENOL) 325 MG tablet Take 2 tablets (650 mg total) by mouth every 6 (six) hours as needed for moderate pain. 05/08/14   Jamal Collin, MD  Camphor-Menthol-Methyl Sal (MUSCLE RUB) 11-28-28 % CREA Apply 1 application topically 2 (two) times daily as needed (Knee pain).    [provider]  lisinopril-hydrochlorothiazide (PRINZIDE,ZESTORETIC) 20-25 MG per tablet Take 1 tablet by mouth daily.    [provider]  meloxicam (MOBIC) 15 MG tablet Take 1 tablet (15 mg total) by mouth daily. 05/08/14   Jamal Collin, MD    Allergies    Patient has no known allergies.  Review of Systems   Review of Systems  Cardiovascular: Positive for near-syncope.  All other systems reviewed and are negative.   Physical Exam Updated Vital Signs BP (!) 155/85 (BP Location: Left Arm)   Pulse 78   Temp 98.6 F (37 C)   Resp 17   LMP 10/20/2020   SpO2 100%   Physical Exam Vitals and nursing note reviewed.  Constitutional:      General: She is not in acute distress.    Appearance: She is well-developed. She is obese.  HENT:     Head: Atraumatic.  Eyes:  Conjunctiva/sclera: Conjunctivae normal.  Neck:     Comments: No thyromegaly Cardiovascular:     Rate and Rhythm: Normal rate and regular rhythm.     Pulses: Normal pulses.     Heart sounds: Normal heart sounds.  Pulmonary:     Effort: Pulmonary effort is normal.     Breath sounds: Normal breath sounds.  Chest:     Chest wall: No tenderness.  Abdominal:     General: Abdomen is flat.     Palpations: Abdomen is soft.     Tenderness: There is no abdominal tenderness.  Musculoskeletal:     Cervical back: Normal range of motion and neck supple.     Right lower leg: No edema.     Left lower leg: No  edema.  Skin:    Findings: No rash.  Neurological:     Mental Status: She is alert. Mental status is at baseline.  Psychiatric:        Mood and Affect: Mood normal.     ED Results / Procedures / Treatments   Labs (all labs ordered are listed, but only abnormal results are displayed) Labs Reviewed  BASIC METABOLIC PANEL - Abnormal; Notable for the following components:      Result Value   Glucose, Bld 108 (*)    All other components within normal limits  CBC  I-STAT BETA HCG BLOOD, ED ([facility], [facility], [facility] ONLY)  TROPONIN I (HIGH SENSITIVITY)  TROPONIN I (HIGH SENSITIVITY)    EKG None  ED ECG REPORT   Date: 11/04/2020  Rate: 81  Rhythm: normal sinus rhythm  QRS Axis: normal  Intervals: normal  ST/T Wave abnormalities: nonspecific ST/T changes  Conduction Disutrbances:none  Narrative Interpretation:   Old EKG Reviewed: none available  I have personally reviewed the EKG tracing and agree with the computerized printout as noted.   Radiology DG Chest 2 View  Result Date: 11/04/2020 CLINICAL DATA:  Chest pain and near syncope today. EXAM: CHEST - 2 VIEW COMPARISON:  PA and lateral chest 07/25/2015. FINDINGS: The lungs are clear. Heart size is normal. No pneumothorax or pleural fluid. No acute bony abnormality. Mild compression fracture in the midthoracic spine is unchanged. IMPRESSION: No acute disease. Electronically Signed   By: Drusilla Kanner M.D.   On: 11/04/2020 11:50    Procedures Procedures   Medications Ordered in ED Medications - No data to display  ED Course  I have reviewed the triage vital signs and the nursing notes.  Pertinent labs & imaging results that were available during my care of the patient were reviewed by me and considered in my medical decision making (see chart for details).    MDM Rules/Calculators/A&P                          BP (!) 153/70   Pulse 73   Temp 98.6 F (37 C)   Resp 15   LMP 10/20/2020   SpO2 100%   Final Clinical  Impression(s) / ED Diagnoses Final diagnoses:  Near syncope  Atypical chest pain    Rx / DC Orders ED Discharge Orders         Ordered    omeprazole (PRILOSEC) 20 MG capsule  Daily        11/04/20 1726         2:56 PM Patient.  With complaints of lightheadedness, and occasional chest pain.  Pain is atypical of ACS.  She is currently chest pain-free.  Work-up initiated.  5:01 PM Patient has normal orthostatic vitals.  Her EKG without concerning arrhythmia or ischemic changes.  Initial troponin is normal, labs are reassuring, no anemia, no electrolyte imbalance, pregnancy test is negative, chest x-ray unremarkable.  Symptoms not consistent with PE.  5:24 PM Negative delta trop.  At this time pt is stable to be discharged and close f/u with PCP for further management of her sxs.  Will provide PPI as her sxs may be due to GERD as it typically flare up during the early morning.    Fayrene Helper, PA-C 11/04/20 1727    Gerhard Munch, MD 11/08/20 [phone]

## 2020-11-04 NOTE — Discharge Instructions (Signed)
You have been evaluated for your symptoms.  Fortunately no concerning findings today.  Call and follow up closely with your doctor for further care.

## 2020-11-23 ENCOUNTER — Encounter (HOSPITAL_COMMUNITY): Payer: Self-pay | Admitting: Emergency Medicine

## 2020-11-23 ENCOUNTER — Other Ambulatory Visit: Payer: Self-pay

## 2020-11-23 ENCOUNTER — Emergency Department (HOSPITAL_COMMUNITY)
Admission: EM | Admit: 2020-11-23 | Discharge: 2020-11-23 | Disposition: A | Discharge status: Home / Self Care | Payer: Self-pay | Attending: Emergency Medicine | Admitting: Emergency Medicine

## 2020-11-23 DIAGNOSIS — R064 Hyperventilation: Secondary | ICD-10-CM | POA: Insufficient documentation

## 2020-11-23 DIAGNOSIS — R110 Nausea: Secondary | ICD-10-CM | POA: Insufficient documentation

## 2020-11-23 DIAGNOSIS — R519 Headache, unspecified: Secondary | ICD-10-CM | POA: Insufficient documentation

## 2020-11-23 DIAGNOSIS — Z79899 Other long term (current) drug therapy: Secondary | ICD-10-CM | POA: Insufficient documentation

## 2020-11-23 DIAGNOSIS — R42 Dizziness and giddiness: Secondary | ICD-10-CM | POA: Insufficient documentation

## 2020-11-23 LAB — CBC WITH DIFFERENTIAL/PLATELET
Abs Immature Granulocytes: 0.05 10*3/uL (ref 0.00–0.07)
Basophils Absolute: 0.1 10*3/uL (ref 0.0–0.1)
Basophils Relative: 1 %
Eosinophils Absolute: 0.1 10*3/uL (ref 0.0–0.5)
Eosinophils Relative: 1 %
HCT: 40.7 % (ref 36.0–46.0)
Hemoglobin: 13 g/dL (ref 12.0–15.0)
Immature Granulocytes: 1 %
Lymphocytes Relative: 19 %
Lymphs Abs: 1.8 10*3/uL (ref 0.7–4.0)
MCH: 27.8 pg (ref 26.0–34.0)
MCHC: 31.9 g/dL (ref 30.0–36.0)
MCV: 87.2 fL (ref 80.0–100.0)
Monocytes Absolute: 0.7 10*3/uL (ref 0.1–1.0)
Monocytes Relative: 7 %
Neutro Abs: 6.7 10*3/uL (ref 1.7–7.7)
Neutrophils Relative %: 71 %
Platelets: 326 10*3/uL (ref 150–400)
RBC: 4.67 MIL/uL (ref 3.87–5.11)
RDW: 13.5 % (ref 11.5–15.5)
WBC: 9.3 10*3/uL (ref 4.0–10.5)
nRBC: 0 % (ref 0.0–0.2)

## 2020-11-23 LAB — BASIC METABOLIC PANEL
Anion gap: 11 (ref 5–15)
BUN: 13 mg/dL (ref 6–20)
CO2: 24 mmol/L (ref 22–32)
Calcium: 9.9 mg/dL (ref 8.9–10.3)
Chloride: 103 mmol/L (ref 98–111)
Creatinine, Ser: 0.6 mg/dL (ref 0.44–1.00)
GFR, Estimated: >60 mL/min (ref >60)
Glucose, Bld: 113 mg/dL — ABNORMAL HIGH (ref 70–99)
Potassium: 3.9 mmol/L (ref 3.5–5.1)
Sodium: 138 mmol/L (ref 135–145)

## 2020-11-23 LAB — I-STAT BETA HCG BLOOD, ED (MC, WL, AP ONLY): I-stat hCG, quantitative: <5 m[IU]/mL (ref <5)

## 2020-11-23 MED ORDER — METOCLOPRAMIDE HCL 5 MG/ML IJ SOLN
10.0000 mg | INTRAMUSCULAR | Status: AC
  Administered 2020-11-23: 10 mg via INTRAVENOUS
  Filled 2020-11-23: qty 2

## 2020-11-23 MED ORDER — KETOROLAC TROMETHAMINE 30 MG/ML IJ SOLN
30.0000 mg | Freq: Once | INTRAMUSCULAR | Status: AC
  Administered 2020-11-23: 30 mg via INTRAVENOUS
  Filled 2020-11-23: qty 1

## 2020-11-23 MED ORDER — DIPHENHYDRAMINE HCL 50 MG/ML IJ SOLN
12.5000 mg | Freq: Once | INTRAMUSCULAR | Status: AC
  Administered 2020-11-23: 12.5 mg via INTRAVENOUS
  Filled 2020-11-23: qty 1

## 2020-11-23 MED ORDER — SODIUM CHLORIDE 0.9 % IV BOLUS
1000.0000 mL | Freq: Once | INTRAVENOUS | Status: AC
  Administered 2020-11-23: 1000 mL via INTRAVENOUS

## 2020-11-23 NOTE — Discharge Instructions (Signed)
Your labs today were normal.  Glad you are feeling better.  I have referred you to neurology for ongoing evaluation/management of your headaches.  They should be contacting you to set up appointment. If you do not hear from them in a few days, I would call their office to check on this. May wish to take tylenol or motrin for headache if it recurs. Return here for any new/acute changes.

## 2020-11-23 NOTE — ED Triage Notes (Signed)
Pt BIB [facility] c/o headache for several days. Pt also c/o CP, lightheadedness, and was found to be hyperventilating upon EMS arrival. Pt in NAD at this time.   EMS VS- 188/96, HR 86, RR 24, SpO2 100 RA, CBG 143

## 2020-11-23 NOTE — ED Notes (Signed)
Pt verbalizes understanding of discharge instructions. Opportunity for questions and answers were provided. Pt discharged from the ED.   ?

## 2020-11-23 NOTE — ED Provider Notes (Signed)
[facility] EMERGENCY DEPARTMENT Provider Note   CSN: [id number] Arrival date & time: 11/23/20  1255     History Chief Complaint  Patient presents with  . Headache    Jill Conrad is a 50 y.o. female.  The history is provided by the patient and medical records. The history is limited by a language barrier. A language interpreter was used.  Headache  50 year old female with history of hypertension, hypercholesterolemia, presenting to the ED with headache.  Has been having headaches for the past month.  States headaches are generally aching and throughout her entire head.  She does have some associated nausea with this but denies vomiting today.  She has had some episodes of lightheadedness and near syncope recently, none today.  Upon EMS arrival, she was found to be hyperventilating but appears more calm on arrival to ED.  Patient has had multiple recent evaluations for similar episodes.  Her primary care doctor ordered an MRI of brain which was done on 11/13/2019 which was normal.  She was not started on any medication for headaches and was not referred to neurology.  States she was previously taking some over-the-counter medications but her doctor told her to stop this because she felt like it was "making her heart race".  She has not had any medications today for her symptoms.  Past Medical History:  Diagnosis Date  . High cholesterol   . Hypertension     There are no problems to display for this patient.   History reviewed. No pertinent surgical history.   OB History   No obstetric history on file.     No family history on file.  Social History   Tobacco Use  . Smoking status: Never Smoker  . Smokeless tobacco: Never Used  Substance Use Topics  . Alcohol use: No  . Drug use: No    Home Medications Prior to Admission medications   Medication Sig Start Date End Date Taking? Authorizing Provider  atorvastatin (LIPITOR) 20 MG tablet Take 20  mg by mouth daily. 10/15/20   [provider]  Camphor-Menthol-Methyl Sal 11-28-28 % CREA Apply 1 application topically 2 (two) times daily as needed (Knee pain).    [provider]  lisinopril-hydrochlorothiazide (PRINZIDE,ZESTORETIC) 20-25 MG per tablet Take 1 tablet by mouth daily.    [provider]  omeprazole (PRILOSEC) 20 MG capsule Take 1 capsule (20 mg total) by mouth daily. 11/04/20   Fayrene Helper, PA-C    Allergies    Patient has no known allergies.  Review of Systems   Review of Systems  Neurological: Positive for headaches.  All other systems reviewed and are negative.   Physical Exam Updated Vital Signs BP (!) 164/86 (BP Location: Right Arm)   Pulse 79   Temp 98.6 F (37 C) (Oral)   Resp 20   Wt 108.9 kg   SpO2 97%   Physical Exam Vitals and nursing note reviewed.  Constitutional:      General: She is not in acute distress.    Appearance: She is well-developed. She is not diaphoretic.     Comments: Awake, alert, NAD  HENT:     Head: Normocephalic and atraumatic.     Right Ear: External ear normal.     Left Ear: External ear normal.  Eyes:     Conjunctiva/sclera: Conjunctivae normal.     Pupils: Pupils are equal, round, and reactive to light.  Neck:     Comments: No rigidity, no meningismus  Cardiovascular:     Rate and Rhythm: Normal rate and regular rhythm.     Heart sounds: Normal heart sounds. No murmur heard.   Pulmonary:     Effort: Pulmonary effort is normal. No respiratory distress.     Breath sounds: Normal breath sounds. No wheezing or rhonchi.  Abdominal:     General: Bowel sounds are normal.     Palpations: Abdomen is soft.     Tenderness: There is no abdominal tenderness. There is no guarding.  Musculoskeletal:        General: Normal range of motion.     Cervical back: Full passive range of motion without pain, normal range of motion and neck supple. No rigidity.  Skin:    General: Skin is warm and dry.      Findings: No rash.  Neurological:     Mental Status: She is alert and oriented to person, place, and time.     Cranial Nerves: No cranial nerve deficit.     Sensory: No sensory deficit.     Motor: No tremor or seizure activity.     Comments: AAOx3, answering questions and following commands appropriately; equal strength UE and LE bilaterally; CN grossly intact; moves all extremities appropriately without ataxia; no focal neuro deficits or facial asymmetry appreciated  Psychiatric:        Behavior: Behavior normal.        Thought Content: Thought content normal.     ED Results / Procedures / Treatments   Labs (all labs ordered are listed, but only abnormal results are displayed) Labs Reviewed  BASIC METABOLIC PANEL - Abnormal; Notable for the following components:      Result Value   Glucose, Bld 113 (*)    All other components within normal limits  CBC WITH DIFFERENTIAL/PLATELET  I-STAT BETA HCG BLOOD, ED ([facility], [facility], [facility] ONLY)    EKG None  Radiology No results found.  Procedures Procedures   Medications Ordered in ED Medications  sodium chloride 0.9 % bolus 1,000 mL (1,000 mLs Intravenous New Bag/Given 11/23/20 1330)  ketorolac (TORADOL) 30 MG/ML injection 30 mg (30 mg Intravenous Given 11/23/20 1331)  metoCLOPramide (REGLAN) injection 10 mg (10 mg Intravenous Given 11/23/20 1334)  diphenhydrAMINE (BENADRYL) injection 12.5 mg (12.5 mg Intravenous Given 11/23/20 1333)    ED Course  I have reviewed the triage vital signs and the nursing notes.  Pertinent labs & imaging results that were available during my care of the patient were reviewed by me and considered in my medical decision making (see chart for details).    MDM Rules/Calculators/A&P  50 y.o. F here with headache and lightheadedness.  Has been having intermittent symptoms for the past month, has had a few ER evaluations for same as well.  Also recently had MRI 11/12/20 which I have reviewed which was normal.  States  headache throughout entire head, some occasional nausea.  Upon EMS arrival she was found to be anxious appearing and hyperventilating, seems calm on arrival to ED.  Her vitals are overall stable.  Continues to complain of diffuse headache.  She has no focal neurologic deficits, no meningeal signs.  As she did have negative MRI within the past 10 days, I do not feel she needs repeat CT or MRI today.  Will obtain basic labs, given migraine cocktail.  Will reassess.  2:31 PM Labs reassuring.  On re-check, patient states she has no pain at all, she feels great right now.  We have reviewed labs.  Given  persistent nature of her headaches, will refer to neurology for ongoing management.  She was advised to call the office if she does not hear from them in the next few days regarding an appointment.  Can follow-up with her primary care doctor in the interim.  Can continue tylenol or motrin PRN headache.  She may return here for any new or acute changes.  Final Clinical Impression(s) / ED Diagnoses Final diagnoses:  Bad headache    Rx / DC Orders ED Discharge Orders         Ordered    Ambulatory referral to Neurology       Comments: An appointment is requested in approximately: 2-4 weeks   11/23/20 1432           Garlon Hatchet, PA-C 11/23/20 1442    Eber Hong, MD 11/26/20 1116

## 2021-11-04 ENCOUNTER — Emergency Department (HOSPITAL_COMMUNITY): Payer: Self-pay

## 2021-11-04 ENCOUNTER — Encounter (HOSPITAL_COMMUNITY): Payer: Self-pay | Admitting: Emergency Medicine

## 2021-11-04 ENCOUNTER — Emergency Department (HOSPITAL_COMMUNITY)
Admission: EM | Admit: 2021-11-04 | Discharge: 2021-11-04 | Disposition: A | Discharge status: Home / Self Care | Payer: Self-pay | Attending: Emergency Medicine | Admitting: Emergency Medicine

## 2021-11-04 ENCOUNTER — Other Ambulatory Visit: Payer: Self-pay

## 2021-11-04 DIAGNOSIS — Z79899 Other long term (current) drug therapy: Secondary | ICD-10-CM | POA: Insufficient documentation

## 2021-11-04 DIAGNOSIS — R112 Nausea with vomiting, unspecified: Secondary | ICD-10-CM | POA: Insufficient documentation

## 2021-11-04 DIAGNOSIS — R509 Fever, unspecified: Secondary | ICD-10-CM | POA: Insufficient documentation

## 2021-11-04 DIAGNOSIS — Z20822 Contact with and (suspected) exposure to covid-19: Secondary | ICD-10-CM | POA: Insufficient documentation

## 2021-11-04 DIAGNOSIS — R109 Unspecified abdominal pain: Secondary | ICD-10-CM | POA: Insufficient documentation

## 2021-11-04 DIAGNOSIS — M791 Myalgia, unspecified site: Secondary | ICD-10-CM | POA: Insufficient documentation

## 2021-11-04 DIAGNOSIS — I10 Essential (primary) hypertension: Secondary | ICD-10-CM | POA: Insufficient documentation

## 2021-11-04 DIAGNOSIS — R519 Headache, unspecified: Secondary | ICD-10-CM | POA: Insufficient documentation

## 2021-11-04 DIAGNOSIS — E278 Other specified disorders of adrenal gland: Secondary | ICD-10-CM

## 2021-11-04 LAB — COMPREHENSIVE METABOLIC PANEL
ALT: 39 U/L (ref 0–44)
AST: 25 U/L (ref 15–41)
Albumin: 3.7 g/dL (ref 3.5–5.0)
Alkaline Phosphatase: 84 U/L (ref 38–126)
Anion gap: 8 (ref 5–15)
BUN: 16 mg/dL (ref 6–20)
CO2: 27 mmol/L (ref 22–32)
Calcium: 9.4 mg/dL (ref 8.9–10.3)
Chloride: 103 mmol/L (ref 98–111)
Creatinine, Ser: 0.71 mg/dL (ref 0.44–1.00)
GFR, Estimated: >60 mL/min (ref >60)
Glucose, Bld: 121 mg/dL — ABNORMAL HIGH (ref 70–99)
Potassium: 4.2 mmol/L (ref 3.5–5.1)
Sodium: 138 mmol/L (ref 135–145)
Total Bilirubin: 0.5 mg/dL (ref 0.3–1.2)
Total Protein: 6.9 g/dL (ref 6.5–8.1)

## 2021-11-04 LAB — URINALYSIS, ROUTINE W REFLEX MICROSCOPIC
Bilirubin Urine: NEGATIVE
Glucose, UA: NEGATIVE mg/dL
Ketones, ur: NEGATIVE mg/dL
Leukocytes,Ua: NEGATIVE
Nitrite: NEGATIVE
Protein, ur: NEGATIVE mg/dL
Specific Gravity, Urine: 1.003 — ABNORMAL LOW (ref 1.005–1.030)
pH: 7 (ref 5.0–8.0)

## 2021-11-04 LAB — CBC
HCT: 37.8 % (ref 36.0–46.0)
Hemoglobin: 12.4 g/dL (ref 12.0–15.0)
MCH: 28.2 pg (ref 26.0–34.0)
MCHC: 32.8 g/dL (ref 30.0–36.0)
MCV: 86.1 fL (ref 80.0–100.0)
Platelets: 272 10*3/uL (ref 150–400)
RBC: 4.39 MIL/uL (ref 3.87–5.11)
RDW: 13.2 % (ref 11.5–15.5)
WBC: 8.2 10*3/uL (ref 4.0–10.5)
nRBC: 0 % (ref 0.0–0.2)

## 2021-11-04 LAB — I-STAT BETA HCG BLOOD, ED (MC, WL, AP ONLY): I-stat hCG, quantitative: <5 m[IU]/mL (ref <5)

## 2021-11-04 LAB — RESP PANEL BY RT-PCR (FLU A&B, COVID) ARPGX2
Influenza A by PCR: NEGATIVE
Influenza B by PCR: NEGATIVE
SARS Coronavirus 2 by RT PCR: NEGATIVE

## 2021-11-04 LAB — LIPASE, BLOOD: Lipase: 36 U/L (ref 11–51)

## 2021-11-04 LAB — URINALYSIS, MICROSCOPIC (REFLEX)

## 2021-11-04 MED ORDER — IOHEXOL 300 MG/ML  SOLN
100.0000 mL | Freq: Once | INTRAMUSCULAR | Status: AC | PRN
  Administered 2021-11-04: 100 mL via INTRAVENOUS

## 2021-11-04 MED ORDER — METOCLOPRAMIDE HCL 5 MG/ML IJ SOLN
10.0000 mg | Freq: Once | INTRAMUSCULAR | Status: AC
  Administered 2021-11-04: 10 mg via INTRAVENOUS
  Filled 2021-11-04: qty 2

## 2021-11-04 MED ORDER — SODIUM CHLORIDE 0.9 % IV BOLUS
1000.0000 mL | Freq: Once | INTRAVENOUS | Status: AC
  Administered 2021-11-04: 1000 mL via INTRAVENOUS

## 2021-11-04 MED ORDER — ONDANSETRON HCL 4 MG/2ML IJ SOLN
4.0000 mg | Freq: Once | INTRAMUSCULAR | Status: AC
  Administered 2021-11-04: 4 mg via INTRAVENOUS
  Filled 2021-11-04: qty 2

## 2021-11-04 MED ORDER — DIPHENHYDRAMINE HCL 50 MG/ML IJ SOLN
12.5000 mg | Freq: Once | INTRAMUSCULAR | Status: AC
Start: 2021-11-04 — End: 2021-11-04
  Administered 2021-11-04: 12.5 mg via INTRAVENOUS
  Filled 2021-11-04: qty 1

## 2021-11-04 MED ORDER — KETOROLAC TROMETHAMINE 30 MG/ML IJ SOLN
30.0000 mg | Freq: Once | INTRAMUSCULAR | Status: AC
  Administered 2021-11-04: 30 mg via INTRAVENOUS
  Filled 2021-11-04: qty 1

## 2021-11-04 NOTE — ED Triage Notes (Signed)
Pt reported to ED with several complaints including headache, nausea/vomiting, shortness of breath and abdominal pain. Pt states that she has been having symptoms for over two weeks. Cannot state what changed to warrant evaluation today. Pt states that she has been having ongoing pain to epigastric area, indigestion and fullness to abdomen accompanied by nausea and vomiting. States symptoms worsen after eating and have caused her to have some loss of appetite and cause headache, shortness of breath and back pain. Pt states she still has her gallbladder but has a hx of surgical removal of 4 cysts from ovaries and colon. Pt also endorses intermittent dizziness.  ?

## 2021-11-04 NOTE — Discharge Instructions (Addendum)
Please follow-up with your primary care provider in the office.  Schedule a follow-up appointment in 1 week.  Please bring the papers that I printed for you to the office.  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  Your doctor should order repeat CT scan in 1 year.  These instructions are in the discharge papers ?

## 2021-11-04 NOTE — ED Provider Notes (Signed)
I assumed care of this patient from Dr Judd Lien.  Briefly this is a 51 year old female presenting with multiple complaints, including abdominal pain, headache, nausea for over 2 weeks.   ? ?Had outpatient MRI brain last year for chronic headaches which was unremarkable per my review of the external records ? ?Covid/flu negative ?Labs unremarkable - no leukocytosis ?Hx of cholecystectomy. ? ?UA without evidence of infection but does show moderate hgb ? ?Pending CTH and CT abdomen, reassessment after scans and medications ?* ? ?Using a Engineer, structural interpreter, I discussed the patient's results with her and her husband.  I do not identify any emergent processes on her CT of the brain or CT abdomen.  We did discuss the nodule noted on her adrenal gland, and I will provide her a printed copy to take to her doctor's office to ensure that she has appropriate repeat imaging done.  I will also give her some IV Reglan and Benadryl prior to discharge.  She complains that she has had chronic ongoing body aches, headaches, nausea, and that her doctor has not been able to diagnose the cause or treated.  I explained that I understand her frustration and I am empathetic, however the role of the emergency room is to rule out medical emergencies, not to perform exhaustive diagnostic workups.  She will need to follow-up with her doctor.  But she and her husband verbalized understanding ?  ?Terald Sleeper, MD ?11/04/21 [phone] ? ?

## 2021-11-04 NOTE — ED Provider Notes (Signed)
?[facility] EMERGENCY DEPARTMENT ?Provider Note ? ? ?CSN: [id number] ?Arrival date & time: 11/04/21  0355 ? ?  ? ?History ? ?Chief Complaint  ?Patient presents with  ? Headache  ? Abdominal Pain  ? ? ?Jill Conrad is a 51 y.o. female. ? ?Patient is a 51 year old female with history of hypertension, hyperlipidemia, obesity.  Patient presenting today with a 2-week history of headaches, intermittent abdominal pain, nausea, vomiting, and feeling fevered.  She denies visual disturbances, numbness, or tingling.  She denies any diarrhea, constipation, or urinary symptoms.  She denies any ill contacts or having consumed any suspicious foods. ? ?The history is provided by the patient.  ? ?  ? ?Home Medications ?Prior to Admission medications   ?Medication Sig Start Date End Date Taking? Authorizing Provider  ?atorvastatin (LIPITOR) 20 MG tablet Take 20 mg by mouth daily. 10/15/20   [provider]  ?Camphor-Menthol-Methyl Sal 11-28-28 % CREA Apply 1 application topically 2 (two) times daily as needed (Knee pain).    [provider]  ?lisinopril-hydrochlorothiazide (PRINZIDE,ZESTORETIC) 20-25 MG per tablet Take 1 tablet by mouth daily.    [provider]  ?omeprazole (PRILOSEC) 20 MG capsule Take 1 capsule (20 mg total) by mouth daily. 11/04/20   Fayrene Helper, PA-C  ?   ? ?Allergies    ?Patient has no known allergies.   ? ?Review of Systems   ?Review of Systems  ?All other systems reviewed and are negative. ? ?Physical Exam ?Updated Vital Signs ?BP (!) 158/79 (BP Location: Right Arm)   Pulse 70   Temp 97.8 ?F (36.6 ?C) (Oral)   Resp 17   SpO2 96%  ?Physical Exam ?Vitals and nursing note reviewed.  ?Constitutional:   ?   General: She is not in acute distress. ?   Appearance: She is well-developed. She is not diaphoretic.  ?HENT:  ?   Head: Normocephalic and atraumatic.  ?Eyes:  ?   General: No visual field deficit. ?   Extraocular Movements: Extraocular movements intact.  ?    Pupils: Pupils are equal, round, and reactive to light.  ?Cardiovascular:  ?   Rate and Rhythm: Normal rate and regular rhythm.  ?   Heart sounds: No murmur heard. ?  No friction rub. No gallop.  ?Pulmonary:  ?   Effort: Pulmonary effort is normal. No respiratory distress.  ?   Breath sounds: Normal breath sounds. No wheezing.  ?Abdominal:  ?   General: Bowel sounds are normal. There is no distension.  ?   Palpations: Abdomen is soft.  ?   Tenderness: There is no abdominal tenderness.  ?Musculoskeletal:     ?   General: Normal range of motion.  ?   Cervical back: Normal range of motion and neck supple.  ?Skin: ?   General: Skin is warm and dry.  ?Neurological:  ?   General: No focal deficit present.  ?   Mental Status: She is alert and oriented to person, place, and time.  ?   Cranial Nerves: No cranial nerve deficit.  ? ? ?ED Results / Procedures / Treatments   ?Labs ?(all labs ordered are listed, but only abnormal results are displayed) ?Labs Reviewed  ?COMPREHENSIVE METABOLIC PANEL - Abnormal; Notable for the following components:  ?    Result Value  ? Glucose, Bld 121 (*)   ? All other components within normal limits  ?URINALYSIS, ROUTINE W REFLEX MICROSCOPIC - Abnormal; Notable for the following components:  ? Specific  Gravity, Urine 1.003 (*)   ? Hgb urine dipstick MODERATE (*)   ? All other components within normal limits  ?URINALYSIS, MICROSCOPIC (REFLEX) - Abnormal; Notable for the following components:  ? Bacteria, UA RARE (*)   ? All other components within normal limits  ?RESP PANEL BY RT-PCR (FLU A&B, COVID) ARPGX2  ?LIPASE, BLOOD  ?CBC  ?I-STAT BETA HCG BLOOD, ED ([facility], [facility], [facility] ONLY)  ? ? ?EKG ?None ? ?Radiology ?No results found. ? ?Procedures ?Procedures  ? ? ?Medications Ordered in ED ?Medications  ?ketorolac (TORADOL) 30 MG/ML injection 30 mg (has no administration in time range)  ?ondansetron (ZOFRAN) injection 4 mg (has no administration in time range)  ?sodium chloride 0.9 % bolus 1,000 mL (has no  administration in time range)  ? ? ?ED Course/ Medical Decision Making/ A&P ? ?Patient presenting with headache and abdominal pain as described in the HPI.  Patient to undergo additional testing including CAT scan of the head, abdomen, and pelvis and laboratory studies.  These are currently pending.  Care signed out to oncoming provider at shift change.  Dr. Renaye Rakers will obtain the results of the studies and determine the final disposition. ? ?Final Clinical Impression(s) / ED Diagnoses ?Final diagnoses:  ?None  ? ? ?Rx / DC Orders ?ED Discharge Orders   ? ? None  ? ?  ? ? ?  ?Geoffery Lyons, MD ?11/09/21 [id number] ? ?

## 2021-12-19 ENCOUNTER — Other Ambulatory Visit: Payer: Self-pay

## 2021-12-19 DIAGNOSIS — Z1231 Encounter for screening mammogram for malignant neoplasm of breast: Secondary | ICD-10-CM

## 2022-02-16 ENCOUNTER — Ambulatory Visit
Admission: RE | Admit: 2022-02-16 | Discharge: 2022-02-16 | Disposition: A | Discharge status: Home / Self Care | Payer: No Typology Code available for payment source | Source: Ambulatory Visit | Attending: Obstetrics and Gynecology | Admitting: Obstetrics and Gynecology

## 2022-02-16 ENCOUNTER — Ambulatory Visit: Payer: Self-pay | Admitting: *Deleted

## 2022-02-16 VITALS — BP 108/60 | Wt 251.2 lb

## 2022-02-16 DIAGNOSIS — Z01419 Encounter for gynecological examination (general) (routine) without abnormal findings: Secondary | ICD-10-CM

## 2022-02-16 DIAGNOSIS — Z1231 Encounter for screening mammogram for malignant neoplasm of breast: Secondary | ICD-10-CM

## 2022-02-16 NOTE — Progress Notes (Signed)
[name] is a 51 y.o. No obstetric history on file. female who presents to [facility] clinic today with no complaints.    Pap Smear: Pap smear completed today. Last Pap smear was 5 years at [facility] clinic and was normal per patient. Per patient has no history of an abnormal Pap smear. Last Pap smear result is not available in Epic.   Physical exam: Breasts Right breast slightly larger than left breast that per patient has not noticed any changes. No skin abnormalities bilateral breasts. Right nipple slightly inverted that per patient is normal for her. No nipple inversion observed left breast. No nipple discharge bilateral breasts. No lymphadenopathy. No lumps palpated bilateral breasts. No complaints of pain or tenderness on exam.      Pelvic/Bimanual Ext Genitalia No lesions, no swelling and no discharge observed on external genitalia.        Vagina Vagina pink and normal texture. No lesions or discharge observed in vagina.        Cervix Cervix is present. Cervix pink and of normal texture. No discharge observed.    Uterus Uterus is present and palpable. Uterus in normal position and normal size.        Adnexae Bilateral ovaries present and palpable. No tenderness on palpation.         Rectovaginal No rectal exam completed today since patient had no rectal complaints. No skin abnormalities observed on exam.     Smoking History: Patient has never smoked.   Patient Navigation: Patient education provided. Access to services provided for patient through [facility] program. Spanish interpreter Thomasene Mohair from [facility] provided.   Colorectal Cancer Screening: Per patient has had colonoscopy completed on 11/24/2021 at [facility].  No complaints today.    Breast and Cervical Cancer Risk Assessment: Patient does not have family history of breast cancer, known genetic mutations, or radiation treatment to the chest before age 52. Patient does not have history of  cervical dysplasia, immunocompromised, or DES exposure in-utero.  Risk Assessment     Risk Scores       02/16/2022   Last edited by: Narda Rutherford, LPN   5-year risk: 1 %   Lifetime risk: 8.6 %            A: B[facility]CP exam with pap smear No complaints.  P: Referred patient to the [facility] for a screening mammogram on mobile unit. Appointment scheduled Thursday, February 16, 2022 at 1410.  Priscille Heidelberg, RN 02/16/2022 1:24 PM

## 2022-02-16 NOTE — Patient Instructions (Signed)
Explained breast self awareness with Jill Conrad. Pap smear completed today. Let her know BCCCP will cover Pap smears and HPV typing every 5 years unless has a history of abnormal Pap smears. Referred patient to the [facility] for a screening mammogram on mobile unit. Appointment scheduled Thursday, February 16, 2022 at 1410. Patient aware of appointment and will be there. Let patient know will follow up with her within the next couple weeks with results of Pap smear by phone. Informed patient that the Breast Center will follow up with her within the next couple of weeks with results of her mammogram by letter or phone. Jill Conrad verbalized understanding.  Jill Conrad, Jill Maser, RN 1:24 PM

## 2022-02-20 ENCOUNTER — Telehealth: Payer: Self-pay

## 2022-02-20 ENCOUNTER — Other Ambulatory Visit: Payer: Self-pay | Admitting: Obstetrics and Gynecology

## 2022-02-20 DIAGNOSIS — R928 Other abnormal and inconclusive findings on diagnostic imaging of breast: Secondary | ICD-10-CM

## 2022-02-20 LAB — CYTOLOGY - PAP
Comment: NEGATIVE
Diagnosis: NEGATIVE
High risk HPV: NEGATIVE

## 2022-02-20 NOTE — Telephone Encounter (Signed)
Via Reginia Forts, [employer] [phone]), attempted to contact patient regarding lab results, [phone], adult female answered call, stated patient was working try again later, [phone], voicemail box not set up.

## 2022-02-22 ENCOUNTER — Encounter: Payer: Self-pay | Admitting: Nurse Practitioner

## 2022-02-22 ENCOUNTER — Other Ambulatory Visit: Payer: Self-pay

## 2022-02-22 ENCOUNTER — Telehealth: Payer: Self-pay

## 2022-02-22 MED ORDER — FLUCONAZOLE 150 MG PO TABS
150.0000 mg | ORAL_TABLET | Freq: Once | ORAL | 0 refills | Status: AC
Start: 2022-02-22 — End: 2022-02-22

## 2022-02-22 NOTE — Telephone Encounter (Signed)
Via Roxton, Spanish Interpreter @ [employer] (# [phone]), Patient informed negative Pap/HPV results, repeat pap in 3-5 years, and suggestive of yeast infection. Patient verbalized understanding, stated she is having some vaginal itching and trouble/discomfort with urination. Patient informed rx for diflucan (fluconazole) will be sent to pharmacy, but if needs to follow-up with pcp if she continues to have trouble with urination. Rx sent to [facility].

## 2022-03-17 ENCOUNTER — Other Ambulatory Visit: Payer: Self-pay

## 2022-04-21 ENCOUNTER — Other Ambulatory Visit: Payer: Self-pay

## 2022-05-12 ENCOUNTER — Other Ambulatory Visit: Payer: Self-pay

## 2022-06-02 ENCOUNTER — Other Ambulatory Visit: Payer: Self-pay

## 2022-06-02 ENCOUNTER — Other Ambulatory Visit: Payer: Self-pay | Admitting: Obstetrics and Gynecology

## 2022-06-02 ENCOUNTER — Ambulatory Visit
Admission: RE | Admit: 2022-06-02 | Discharge: 2022-06-02 | Disposition: A | Discharge status: Home / Self Care | Payer: No Typology Code available for payment source | Source: Ambulatory Visit | Attending: Obstetrics and Gynecology | Admitting: Obstetrics and Gynecology

## 2022-06-02 ENCOUNTER — Ambulatory Visit
Admission: RE | Admit: 2022-06-02 | Discharge: 2022-06-02 | Disposition: A | Discharge status: Home / Self Care | Payer: Self-pay | Source: Ambulatory Visit | Attending: Obstetrics and Gynecology | Admitting: Obstetrics and Gynecology

## 2022-06-02 DIAGNOSIS — R921 Mammographic calcification found on diagnostic imaging of breast: Secondary | ICD-10-CM

## 2022-06-02 DIAGNOSIS — R928 Other abnormal and inconclusive findings on diagnostic imaging of breast: Secondary | ICD-10-CM

## 2022-06-23 ENCOUNTER — Ambulatory Visit
Admission: RE | Admit: 2022-06-23 | Discharge: 2022-06-23 | Disposition: A | Discharge status: Home / Self Care | Payer: No Typology Code available for payment source | Source: Ambulatory Visit | Attending: Obstetrics and Gynecology | Admitting: Obstetrics and Gynecology

## 2022-06-23 DIAGNOSIS — R921 Mammographic calcification found on diagnostic imaging of breast: Secondary | ICD-10-CM

## 2022-06-23 HISTORY — PX: BREAST BIOPSY: SHX20

## 2022-08-11 ENCOUNTER — Ambulatory Visit: Payer: Self-pay | Admitting: General Surgery

## 2022-08-11 DIAGNOSIS — N6092 Unspecified benign mammary dysplasia of left breast: Secondary | ICD-10-CM

## 2022-08-11 MED ORDER — KETOROLAC TROMETHAMINE 15 MG/ML IJ SOLN: 15.0000 mg | Freq: Once | INTRAMUSCULAR | Status: AC

## 2022-08-23 ENCOUNTER — Other Ambulatory Visit: Payer: Self-pay | Admitting: General Surgery

## 2022-08-23 DIAGNOSIS — N6092 Unspecified benign mammary dysplasia of left breast: Secondary | ICD-10-CM

## 2022-09-04 ENCOUNTER — Other Ambulatory Visit: Payer: No Typology Code available for payment source

## 2022-09-04 ENCOUNTER — Ambulatory Visit: Payer: No Typology Code available for payment source | Admitting: Hematology and Oncology

## 2022-09-07 ENCOUNTER — Telehealth (HOSPITAL_COMMUNITY): Payer: Self-pay

## 2022-09-08 ENCOUNTER — Inpatient Hospital Stay: Payer: No Typology Code available for payment source

## 2022-09-08 ENCOUNTER — Inpatient Hospital Stay
Payer: No Typology Code available for payment source | Attending: Hematology and Oncology | Admitting: Hematology and Oncology

## 2022-09-08 ENCOUNTER — Encounter: Payer: Self-pay | Admitting: Hematology and Oncology

## 2022-09-08 ENCOUNTER — Other Ambulatory Visit: Payer: Self-pay

## 2022-09-08 VITALS — BP 134/64 | HR 72 | Temp 97.8°F | Resp 16 | Ht 64.0 in | Wt 242.8 lb

## 2022-09-08 DIAGNOSIS — N6092 Unspecified benign mammary dysplasia of left breast: Secondary | ICD-10-CM | POA: Insufficient documentation

## 2022-09-08 DIAGNOSIS — I10 Essential (primary) hypertension: Secondary | ICD-10-CM

## 2022-09-08 DIAGNOSIS — N6099 Unspecified benign mammary dysplasia of unspecified breast: Secondary | ICD-10-CM | POA: Insufficient documentation

## 2022-09-08 NOTE — Progress Notes (Signed)
[place] CONSULT NOTE  Patient Care Team: Pcp, No as PCP - General  CHIEF COMPLAINTS/PURPOSE OF CONSULTATION:  Newly diagnosed breast cancer  ASSESSMENT AND PLAN:   Atypical lobular hyperplasia (ALH) of breast This is a 52 year old female patient with newly diagnosed atypical lobular hyperplasia referred to breast oncology for recommendations.  We have discussed the following details about atypical lobular hyperplasia and role of tamoxifen.  Atypical lobular hyperplasia: This is characterized by abnormal cells that are filling part of the lobule.  It appears to increase the risk of breast cancer by 3.7-5.3 fold.  There is risk of both ipsilateral and contralateral breast cancers.  The cumulative incidence of breast cancer is approximately 1 %/year.  Risk reduction strategies: I recommended appropriate lifestyle and dietary changes that include exercise, eating less red meat and increasing fruits and vegetables and weight loss Risk reduction with tamoxifen or raloxifene would reduce the risk by half.  Tamoxifen counseling: We discussed the risks and benefits of tamoxifen. These include but not limited to insomnia, hot flashes, mood changes, vaginal dryness, and weight gain. Although rare, serious side effects including endometrial cancer, risk of blood clots were also discussed. We strongly believe that the benefits far outweigh the risks. Patient understands these risks and consented to starting treatment. Planned treatment duration is 5 years. We have also discussed about role of aromatase inhibitors since she is postmenopausal, last menstrual cycle was over 2 years ago.  I discussed mechanism of action of aromatase inhibitors and adverse effects including but not limited to postmenopausal symptoms, bone density loss, questionable increased risk of cardiovascular events  Breast cancer surveillance: Annual mammograms, consider breast MRI because a lifetime risk of breast cancer  risk of more than 20% along with breast exams.  She is interested in trying tamoxifen or anastrozole after surgery.  She was recommended to return to clinic after surgery.    HISTORY OF PRESENTING ILLNESS:  Jill Conrad 52 y.o. female is here because of recent diagnosis of left breast calcifications.  Patient has screening mammogram and was recalled from screening.  Diagnostic mammogram showed indeterminate left breast calcification and probably benign bilateral breast masses.  Ultrasound confirmed the above-mentioned findings. She had left breast needle core biopsy upper outer region which showed focal atypical lobular hyperplasia and fibrocystic changes with calcifications. She was seen by Dr. Marlou Starks on February 16 and is scheduled for left breast lumpectomy with radioactive seed localization.  She is now referred to medical oncology given atypical lobular hyperplasia. I reviewed her records extensively and collaborated the history with the patient.  Certified Spanish interpreter used. Jill Conrad #[id number]  Patient is here for an initial visit.  She denies any new complaints.  Many years ago she had an abnormal mammogram.  She denies any known family history of breast cancer in immediate family members however she remembers her mom sisters had breast cancer in her 85s.  She has 66 child, 42 years old.  She is compliant with all her medications.  Rest of the pertinent 10 point ROS reviewed and negative  MEDICAL HISTORY:  Past Medical History:  Diagnosis Date   High cholesterol    Hypertension     SURGICAL HISTORY: Past Surgical History:  Procedure Laterality Date   BREAST BIOPSY Left 06/23/2022   MM LT BREAST BX W LOC DEV 1ST LESION IMAGE BX SPEC STEREO GUIDE 06/23/2022 [facility]    SOCIAL HISTORY: Social History   Socioeconomic History   Marital status: Married  Spouse name: Not on file   Number of children: 1   Years of education: Not on file   Highest education  level: 5th grade  Occupational History   Not on file  Tobacco Use   Smoking status: Never   Smokeless tobacco: Never  Vaping Use   Vaping Use: Never used  Substance and Sexual Activity   Alcohol use: No   Drug use: No   Sexual activity: Yes    Birth control/protection: Post-menopausal  Other Topics Concern   Not on file  Social History Narrative   Not on file   Social Determinants of Health   Financial Resource Strain: Not on file  Food Insecurity: No Food Insecurity (02/16/2022)   Hunger Vital Sign    Worried About Running Out of Food in the Last Year: Never true    Ran Out of Food in the Last Year: Never true  Transportation Needs: No Transportation Needs (02/16/2022)   PRAPARE - Administrator, Civil Service (Medical): No    Lack of Transportation (Non-Medical): No  Physical Activity: Not on file  Stress: Not on file  Social Connections: Not on file  Intimate Partner Violence: Not on file    FAMILY HISTORY: Family History  Problem Relation Age of Onset   Breast cancer Neg Hx     ALLERGIES:  has No Known Allergies.  MEDICATIONS:  Current Outpatient Medications  Medication Sig Dispense Refill   atorvastatin (LIPITOR) 20 MG tablet Take 20 mg by mouth daily.     busPIRone (BUSPAR) 5 MG tablet Take 5 mg by mouth 2 (two) times daily.     citalopram (CELEXA) 10 MG tablet Take 10 mg by mouth at bedtime.     esomeprazole (NEXIUM) 40 MG capsule Take 40 mg by mouth daily.     lisinopril (ZESTRIL) 20 MG tablet Take 20 mg by mouth daily.     omeprazole (PRILOSEC) 20 MG capsule Take 1 capsule (20 mg total) by mouth daily. (Patient not taking: Reported on 11/04/2021) 30 capsule 0   ondansetron (ZOFRAN) 4 MG tablet Take 4 mg by mouth 2 (two) times daily as needed for nausea or vomiting.     pantoprazole (PROTONIX) 20 MG tablet Take 20 mg by mouth daily.     No current facility-administered medications for this visit.    REVIEW OF SYSTEMS:   Constitutional: Denies  fevers, chills or abnormal night sweats Eyes: Denies blurriness of vision, double vision or watery eyes Ears, nose, mouth, throat, and face: Denies mucositis or sore throat Respiratory: Denies cough, dyspnea or wheezes Cardiovascular: Denies palpitation, chest discomfort or lower extremity swelling Gastrointestinal:  Denies nausea, heartburn or change in bowel habits Skin: Denies abnormal skin rashes Lymphatics: Denies new lymphadenopathy or easy bruising Neurological:Denies numbness, tingling or new weaknesses Behavioral/Psych: Mood is stable, no new changes  Breast: Denies any palpable lumps or discharge All other systems were reviewed with the patient and are negative.  PHYSICAL EXAMINATION: ECOG PERFORMANCE STATUS: 0 - Asymptomatic  Vitals:   09/08/22 1026  BP: 134/64  Pulse: 72  Resp: 16  Temp: 97.8 F (36.6 C)  SpO2: 98%   Filed Weights   09/08/22 1026  Weight: 242 lb 12.8 oz (110.1 kg)    GENERAL:alert, no distress and comfortable NECK: supple, thyroid normal size, non-tender, without nodularity Chest: CTA bilaterally Heart: RRR Abdomen: Soft, non tender, non distended. PSYCH: alert & oriented x 3 with fluent speech NEURO: no focal motor/sensory deficits  LABORATORY DATA:  I have reviewed the data as listed Lab Results  Component Value Date   WBC 8.2 11/04/2021   HGB 12.4 11/04/2021   HCT 37.8 11/04/2021   MCV 86.1 11/04/2021   PLT 272 11/04/2021   Lab Results  Component Value Date   NA 138 11/04/2021   K 4.2 11/04/2021   CL 103 11/04/2021   CO2 27 11/04/2021    RADIOGRAPHIC STUDIES: I have personally reviewed the radiological reports and agreed with the findings in the report.  Total time spent: 45 minutes including history, physical exam, review of records, counseling and coordination of care All questions were answered. The patient knows to call the clinic with any problems, questions or concerns.    Benay Pike, MD 09/08/22

## 2022-09-08 NOTE — Assessment & Plan Note (Addendum)
This is a 52 year old female patient with newly diagnosed atypical lobular hyperplasia referred to breast oncology for recommendations.  We have discussed the following details about atypical lobular hyperplasia and role of tamoxifen.  Atypical lobular hyperplasia: This is characterized by abnormal cells that are filling part of the lobule.  It appears to increase the risk of breast cancer by 3.7-5.3 fold.  There is risk of both ipsilateral and contralateral breast cancers.  The cumulative incidence of breast cancer is approximately 1 %/year.  Risk reduction strategies: I recommended appropriate lifestyle and dietary changes that include exercise, eating less red meat and increasing fruits and vegetables and weight loss Risk reduction with tamoxifen or raloxifene would reduce the risk by half.  Tamoxifen counseling: We discussed the risks and benefits of tamoxifen. These include but not limited to insomnia, hot flashes, mood changes, vaginal dryness, and weight gain. Although rare, serious side effects including endometrial cancer, risk of blood clots were also discussed. We strongly believe that the benefits far outweigh the risks. Patient understands these risks and consented to starting treatment. Planned treatment duration is 5 years. We have also discussed about role of aromatase inhibitors since she is postmenopausal, last menstrual cycle was over 2 years ago.  I discussed mechanism of action of aromatase inhibitors and adverse effects including but not limited to postmenopausal symptoms, bone density loss, questionable increased risk of cardiovascular events  Breast cancer surveillance: Annual mammograms, consider breast MRI because a lifetime risk of breast cancer risk of more than 20% along with breast exams.  She is interested in trying tamoxifen or anastrozole after surgery.  She was recommended to return to clinic after surgery.

## 2022-09-21 IMAGING — CT CT ABD-PELV W/ CM
2 of 5 series · 16 of 46 positions shown, 18 images · IV contrast (APPLIED)
Comparison: None

CLINICAL DATA: A 50-year-old female presents for evaluation of
acute nonlocalized abdominal pain.

EXAM:
CT ABDOMEN AND PELVIS WITH CONTRAST
TECHNIQUE: Multidetector CT imaging of the abdomen and pelvis was performed
using the standard protocol following bolus administration of
intravenous contrast.

[Series 3: abdomen 5.0 · axial · 0.96mm/px · z∈[-1165,-785]mm · 13 of 88 slices shown, 15 images]
[im 6/88  soft-tissue]
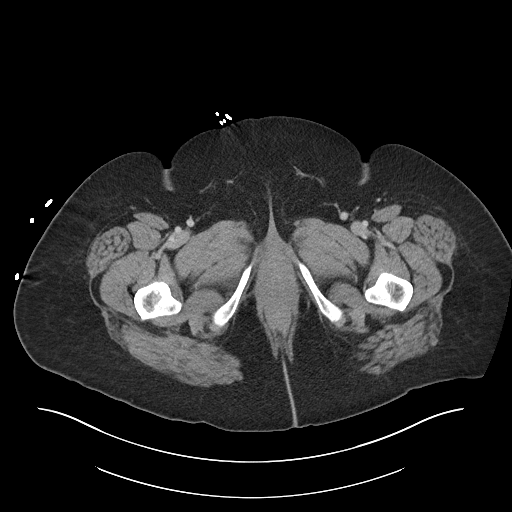
[im 6/88  bone]
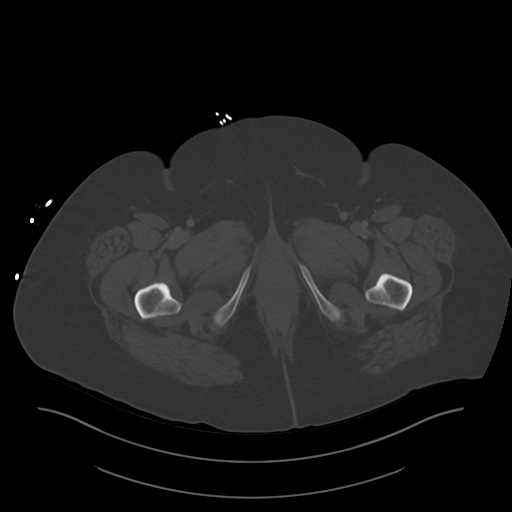
[im 12/88  soft-tissue]
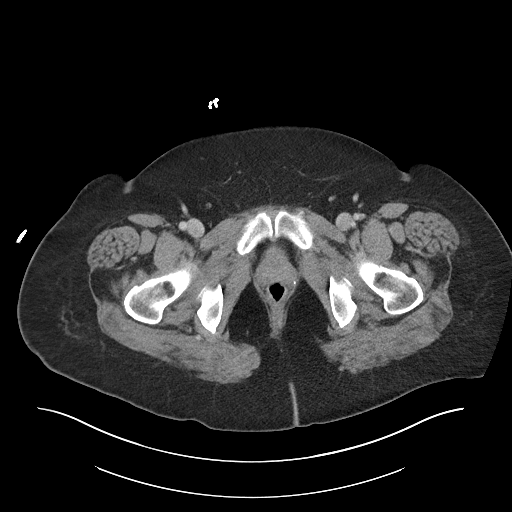
[im 18/88  soft-tissue]
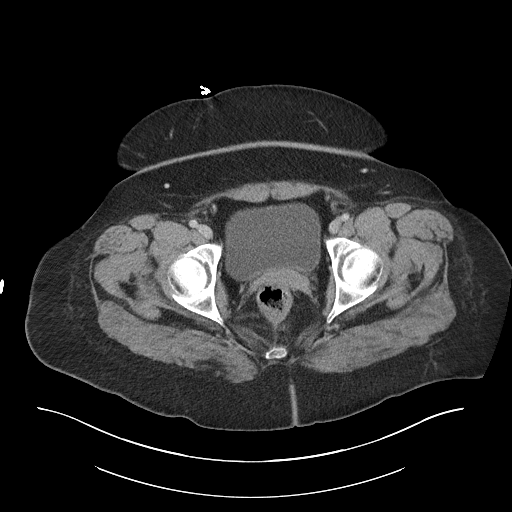
[im 24/88  soft-tissue]
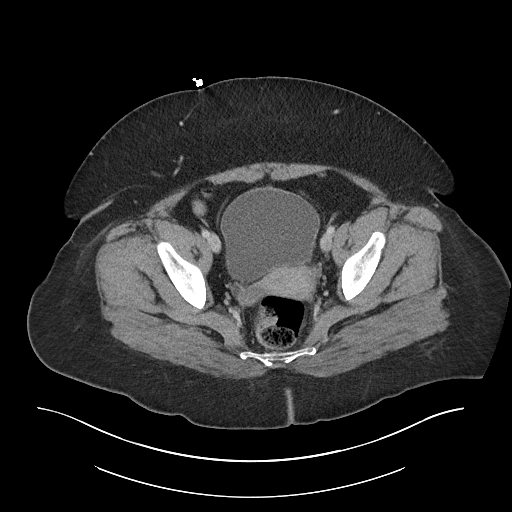
[im 30/88  soft-tissue]
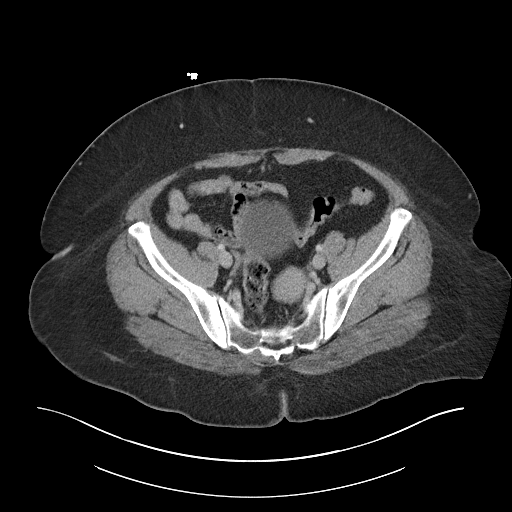
[im 35/88  soft-tissue]
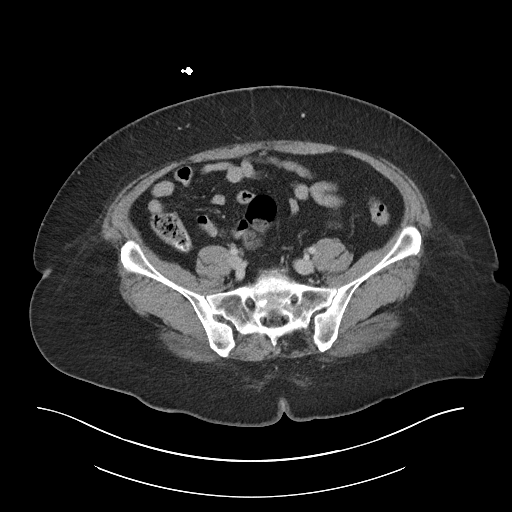
[im 47/88  soft-tissue]
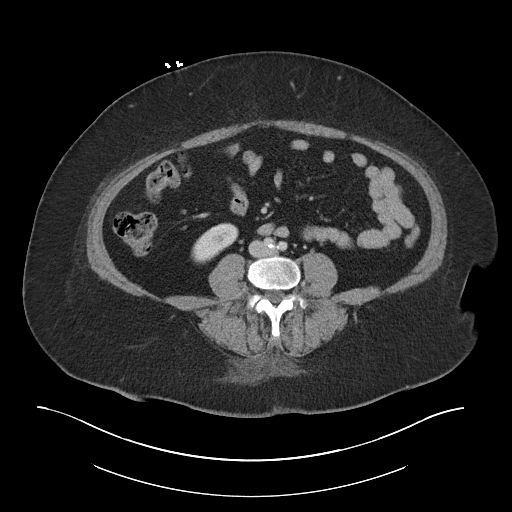
[im 53/88  soft-tissue]
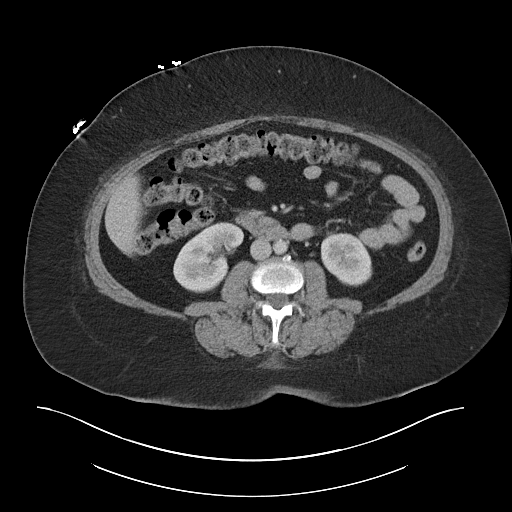
[im 59/88  soft-tissue]
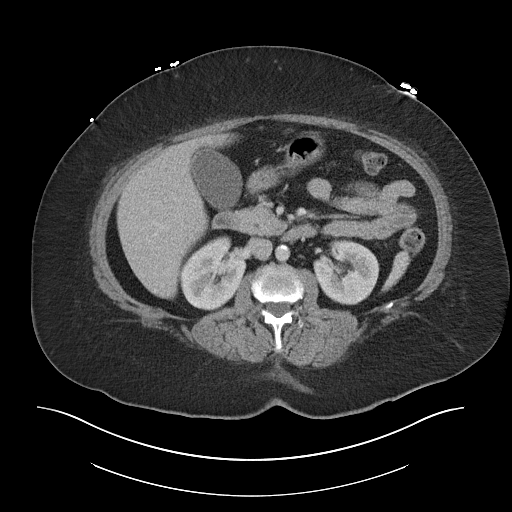
[im 59/88  bone]
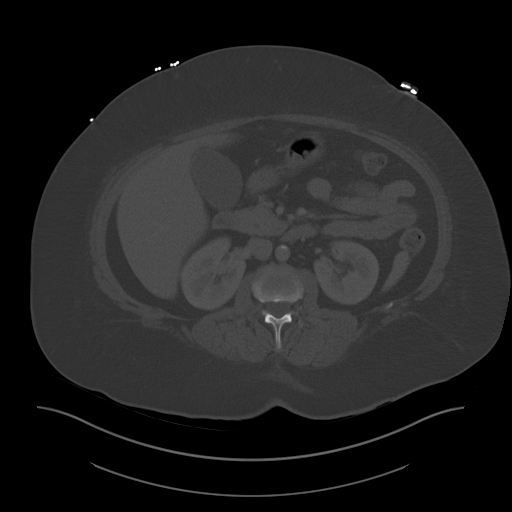
[im 64/88  soft-tissue]
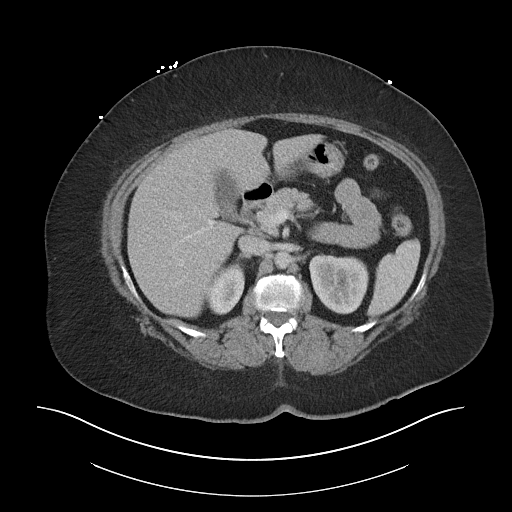
[im 70/88  soft-tissue]
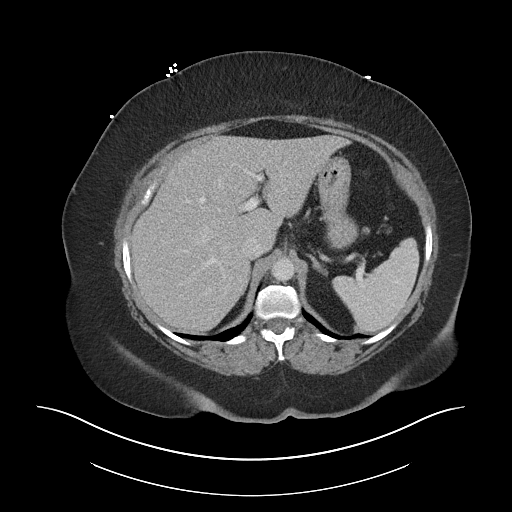
[im 76/88  soft-tissue]
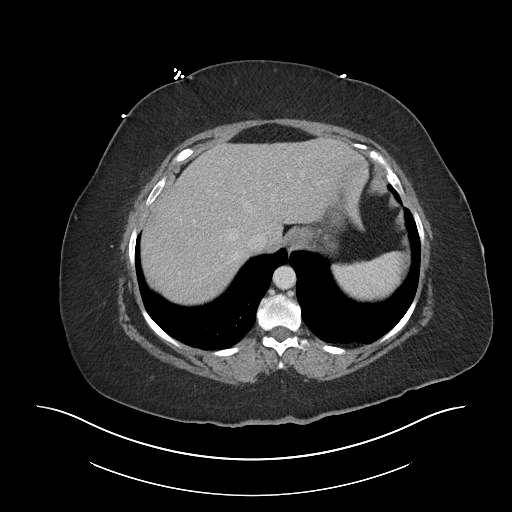
[im 82/88  soft-tissue]
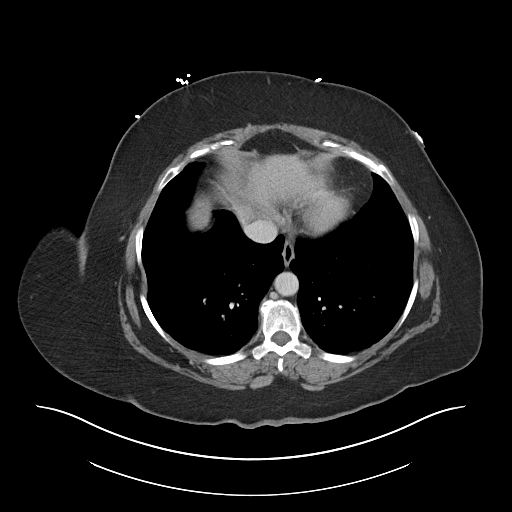

[Series 6: abdomen 3.0 mpr cor · coronal · 0.86mm/px · 3 of 124 slices shown]
[im 42/124  soft-tissue]
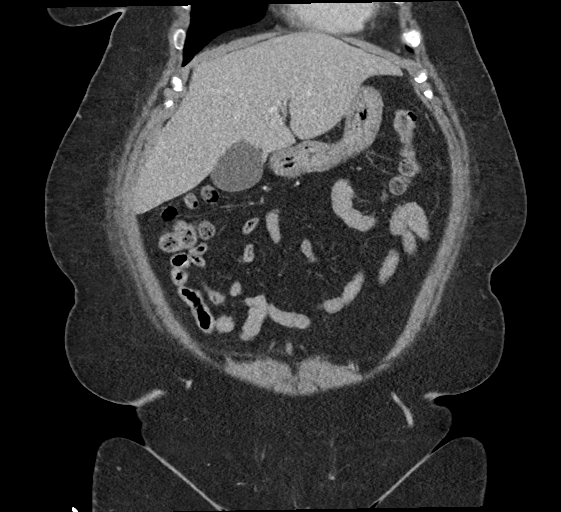
[im 55/124  soft-tissue]
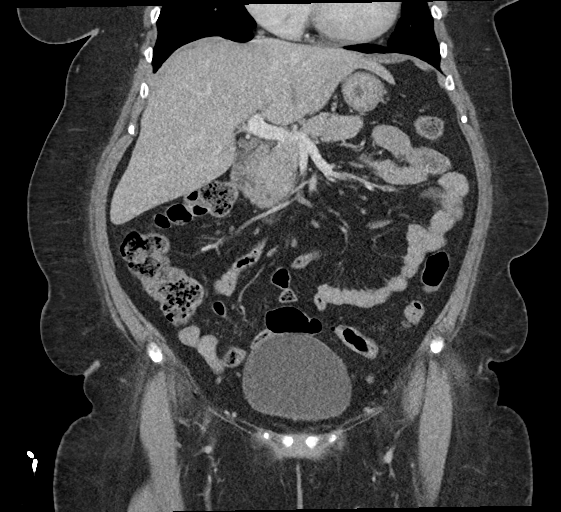
[im 69/124  soft-tissue]
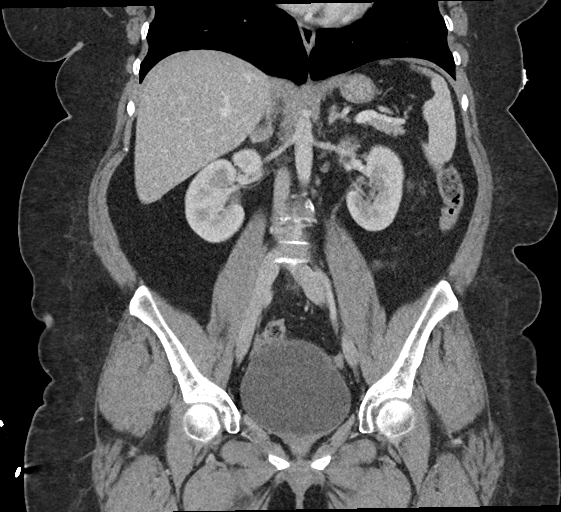

[16 of 46 positions shown; findings below may reference images not displayed]

RADIATION DOSE REDUCTION: This exam was performed according to the
departmental dose-optimization program which includes automated
exposure control, adjustment of the mA and/or kV according to
patient size and/or use of iterative reconstruction technique.

CONTRAST:  100mL OMNIPAQUE IOHEXOL 300 MG/ML  SOLN
FINDINGS: Lower chest: Lung bases are clear. No effusion. No consolidative
changes.

Hepatobiliary: Hepatic steatosis without focal lesion. Liver span is
normal. No biliary duct distension. No pericholecystic stranding.

Pancreas: Normal, without mass, inflammation or ductal dilatation.

Spleen: Normal.

Adrenals/Urinary Tract: Well-circumscribed RIGHT adrenal nodule
measures 1.8 x 1.4 cm with density of 58 Hounsfield units and
homogeneous appearance. LEFT adrenal gland is normal.

Kidneys enhance symmetrically without focal lesion or perinephric
stranding. No hydronephrosis. No perivesical stranding.

Stomach/Bowel: No acute gastrointestinal process. The appendix is
normal. No stranding adjacent to stomach or small bowel.

Vascular/Lymphatic:

Aortic atherosclerosis. No sign of aneurysm. Smooth contour of the
IVC. There is no gastrohepatic or hepatoduodenal ligament
lymphadenopathy. No retroperitoneal or mesenteric lymphadenopathy.

No pelvic sidewall lymphadenopathy.

Reproductive: Unremarkable by CT.

Other: No ascites.  No pneumoperitoneum.

Musculoskeletal: No acute bone finding. No destructive bone process.
Spinal degenerative changes.
IMPRESSION: 1. No acute findings.  Normal appendix.
2. Hepatic steatosis without focal lesion.
3. 1.8 cm right adrenal mass, probable benign adenoma. Recommend 1
year follow up adrenal washout CT. If stable for > 1 year, no
further f/u imaging.
JACR [DATE]):2229-44, JCAT 5342 [REDACTED]; 40(2):194-200, Urol
[address]; 3(2):71-4.
4. Aortic atherosclerosis.

Aortic Atherosclerosis ([id number].E).

## 2022-10-03 ENCOUNTER — Encounter (HOSPITAL_BASED_OUTPATIENT_CLINIC_OR_DEPARTMENT_OTHER): Payer: Self-pay | Admitting: General Surgery

## 2022-10-05 ENCOUNTER — Ambulatory Visit
Admission: RE | Admit: 2022-10-05 | Discharge: 2022-10-05 | Disposition: A | Discharge status: Home / Self Care | Payer: No Typology Code available for payment source | Source: Ambulatory Visit | Attending: General Surgery | Admitting: General Surgery

## 2022-10-05 DIAGNOSIS — N6092 Unspecified benign mammary dysplasia of left breast: Secondary | ICD-10-CM

## 2022-10-05 HISTORY — PX: BREAST BIOPSY: SHX20

## 2022-10-05 NOTE — Progress Notes (Signed)

## 2022-10-05 NOTE — Anesthesia Preprocedure Evaluation (Addendum)
Anesthesia Evaluation  Patient identified by MRN, date of birth, ID band Patient awake    Reviewed: Allergy & Precautions, NPO status , Patient's Chart, lab work & pertinent test results  Airway Mallampati: III  TM Distance: >3 FB Neck ROM: Full    Dental  (+) Edentulous Upper, Edentulous Lower, Dental Advisory Given   Pulmonary neg pulmonary ROS   Pulmonary exam normal breath sounds clear to auscultation       Cardiovascular hypertension, Pt. on medications Normal cardiovascular exam Rhythm:Regular Rate:Normal     Neuro/Psych negative neurological ROS     GI/Hepatic Neg liver ROS,GERD  Medicated and Controlled,,  Endo/Other  negative endocrine ROS    Renal/GU negative Renal ROS     Musculoskeletal negative musculoskeletal ROS (+)    Abdominal  (+) + obese  Peds  Hematology negative hematology ROS (+)   Anesthesia Other Findings   Reproductive/Obstetrics                             Anesthesia Physical Anesthesia Plan  ASA: 3  Anesthesia Plan: General   Post-op Pain Management: Tylenol PO (pre-op)* and Gabapentin PO (pre-op)*   Induction: Intravenous  PONV Risk Score and Plan: 4 or greater and Ondansetron, Dexamethasone, Treatment may vary due to age or medical condition, Midazolam and Scopolamine patch - Pre-op  Airway Management Planned: LMA  Additional Equipment: None  Intra-op Plan:   Post-operative Plan: Extubation in OR  Informed Consent: I have reviewed the patients History and Physical, chart, labs and discussed the procedure including the risks, benefits and alternatives for the proposed anesthesia with the patient or authorized representative who has indicated his/her understanding and acceptance.     Dental advisory given  Plan Discussed with: CRNA  Anesthesia Plan Comments:        Anesthesia Quick Evaluation

## 2022-10-06 ENCOUNTER — Other Ambulatory Visit: Payer: Self-pay

## 2022-10-06 ENCOUNTER — Ambulatory Visit (HOSPITAL_BASED_OUTPATIENT_CLINIC_OR_DEPARTMENT_OTHER): Payer: No Typology Code available for payment source | Admitting: Anesthesiology

## 2022-10-06 ENCOUNTER — Ambulatory Visit
Admission: RE | Admit: 2022-10-06 | Discharge: 2022-10-06 | Disposition: A | Discharge status: Home / Self Care | Payer: No Typology Code available for payment source | Source: Ambulatory Visit | Attending: General Surgery | Admitting: General Surgery

## 2022-10-06 ENCOUNTER — Encounter (HOSPITAL_BASED_OUTPATIENT_CLINIC_OR_DEPARTMENT_OTHER)
Admission: RE | Disposition: A | Discharge status: Home / Self Care | Payer: Self-pay | Source: Ambulatory Visit | Attending: General Surgery

## 2022-10-06 ENCOUNTER — Ambulatory Visit (HOSPITAL_BASED_OUTPATIENT_CLINIC_OR_DEPARTMENT_OTHER)
Admission: RE | Admit: 2022-10-06 | Discharge: 2022-10-06 | Disposition: A | Discharge status: Home / Self Care | Payer: No Typology Code available for payment source | Source: Ambulatory Visit | Attending: General Surgery | Admitting: General Surgery

## 2022-10-06 DIAGNOSIS — N6092 Unspecified benign mammary dysplasia of left breast: Secondary | ICD-10-CM | POA: Insufficient documentation

## 2022-10-06 DIAGNOSIS — I10 Essential (primary) hypertension: Secondary | ICD-10-CM | POA: Insufficient documentation

## 2022-10-06 DIAGNOSIS — Z01818 Encounter for other preprocedural examination: Secondary | ICD-10-CM

## 2022-10-06 DIAGNOSIS — Z6841 Body Mass Index (BMI) 40.0 and over, adult: Secondary | ICD-10-CM

## 2022-10-06 DIAGNOSIS — E669 Obesity, unspecified: Secondary | ICD-10-CM

## 2022-10-06 HISTORY — DX: Gastro-esophageal reflux disease without esophagitis: K21.9

## 2022-10-06 HISTORY — PX: BREAST LUMPECTOMY WITH RADIOACTIVE SEED LOCALIZATION: SHX6424

## 2022-10-06 SURGERY — BREAST LUMPECTOMY WITH RADIOACTIVE SEED LOCALIZATION
Anesthesia: General | Site: Breast | Laterality: Left

## 2022-10-06 MED ORDER — CEFAZOLIN SODIUM-DEXTROSE 2-4 GM/100ML-% IV SOLN
INTRAVENOUS | Status: AC
  Filled 2022-10-06: qty 100

## 2022-10-06 MED ORDER — OXYCODONE HCL 5 MG PO TABS: 5.0000 mg | ORAL_TABLET | Freq: Once | ORAL | Status: DC | PRN

## 2022-10-06 MED ORDER — CHLORHEXIDINE GLUCONATE CLOTH 2 % EX PADS: 6.0000 | MEDICATED_PAD | Freq: Once | CUTANEOUS | Status: DC

## 2022-10-06 MED ORDER — CEFAZOLIN SODIUM-DEXTROSE 2-4 GM/100ML-% IV SOLN
2.0000 g | INTRAVENOUS | Status: AC
  Administered 2022-10-06: 2 g via INTRAVENOUS

## 2022-10-06 MED ORDER — ACETAMINOPHEN 500 MG PO TABS
1000.0000 mg | ORAL_TABLET | ORAL | Status: AC
  Administered 2022-10-06: 1000 mg via ORAL

## 2022-10-06 MED ORDER — PROMETHAZINE HCL 25 MG/ML IJ SOLN: 6.2500 mg | INTRAMUSCULAR | Status: DC | PRN

## 2022-10-06 MED ORDER — GABAPENTIN 300 MG PO CAPS
300.0000 mg | ORAL_CAPSULE | ORAL | Status: AC
  Administered 2022-10-06: 300 mg via ORAL

## 2022-10-06 MED ORDER — PROPOFOL 10 MG/ML IV BOLUS
INTRAVENOUS | Status: DC | PRN
  Administered 2022-10-06: 180 mg via INTRAVENOUS

## 2022-10-06 MED ORDER — DEXAMETHASONE SODIUM PHOSPHATE 10 MG/ML IJ SOLN
INTRAMUSCULAR | Status: AC
  Filled 2022-10-06: qty 1

## 2022-10-06 MED ORDER — MEPERIDINE HCL 25 MG/ML IJ SOLN: 6.2500 mg | INTRAMUSCULAR | Status: DC | PRN

## 2022-10-06 MED ORDER — ONDANSETRON HCL 4 MG/2ML IJ SOLN
INTRAMUSCULAR | Status: DC | PRN
  Administered 2022-10-06: 4 mg via INTRAVENOUS

## 2022-10-06 MED ORDER — KETOROLAC TROMETHAMINE 30 MG/ML IJ SOLN
INTRAMUSCULAR | Status: AC
  Filled 2022-10-06: qty 1

## 2022-10-06 MED ORDER — LIDOCAINE 2% (20 MG/ML) 5 ML SYRINGE
INTRAMUSCULAR | Status: AC
  Filled 2022-10-06: qty 5

## 2022-10-06 MED ORDER — BUPIVACAINE HCL (PF) 0.25 % IJ SOLN
INTRAMUSCULAR | Status: AC
  Filled 2022-10-06: qty 30

## 2022-10-06 MED ORDER — SCOPOLAMINE 1 MG/3DAYS TD PT72
MEDICATED_PATCH | TRANSDERMAL | Status: DC | PRN
  Administered 2022-10-06: 1 via TRANSDERMAL

## 2022-10-06 MED ORDER — HYDROMORPHONE HCL 1 MG/ML IJ SOLN: 0.2500 mg | INTRAMUSCULAR | Status: DC | PRN

## 2022-10-06 MED ORDER — KETOROLAC TROMETHAMINE 30 MG/ML IJ SOLN
INTRAMUSCULAR | Status: DC | PRN
  Administered 2022-10-06: 30 mg via INTRAVENOUS

## 2022-10-06 MED ORDER — DEXAMETHASONE SODIUM PHOSPHATE 4 MG/ML IJ SOLN
INTRAMUSCULAR | Status: DC | PRN
  Administered 2022-10-06: 5 mg via INTRAVENOUS

## 2022-10-06 MED ORDER — GABAPENTIN 300 MG PO CAPS
ORAL_CAPSULE | ORAL | Status: AC
  Filled 2022-10-06: qty 1

## 2022-10-06 MED ORDER — LIDOCAINE HCL (CARDIAC) PF 100 MG/5ML IV SOSY
PREFILLED_SYRINGE | INTRAVENOUS | Status: DC | PRN
  Administered 2022-10-06: 50 mg via INTRAVENOUS

## 2022-10-06 MED ORDER — SCOPOLAMINE 1 MG/3DAYS TD PT72
MEDICATED_PATCH | TRANSDERMAL | Status: AC
  Filled 2022-10-06: qty 1

## 2022-10-06 MED ORDER — AMISULPRIDE (ANTIEMETIC) 5 MG/2ML IV SOLN: 10.0000 mg | Freq: Once | INTRAVENOUS | Status: DC | PRN

## 2022-10-06 MED ORDER — FENTANYL CITRATE (PF) 100 MCG/2ML IJ SOLN
INTRAMUSCULAR | Status: DC | PRN
  Administered 2022-10-06 (×2): 25 ug via INTRAVENOUS
  Administered 2022-10-06: 50 ug via INTRAVENOUS

## 2022-10-06 MED ORDER — OXYCODONE HCL 5 MG/5ML PO SOLN: 5.0000 mg | Freq: Once | ORAL | Status: DC | PRN

## 2022-10-06 MED ORDER — FENTANYL CITRATE (PF) 100 MCG/2ML IJ SOLN
INTRAMUSCULAR | Status: AC
  Filled 2022-10-06: qty 2

## 2022-10-06 MED ORDER — MIDAZOLAM HCL 2 MG/2ML IJ SOLN
INTRAMUSCULAR | Status: AC
  Filled 2022-10-06: qty 2

## 2022-10-06 MED ORDER — PROPOFOL 10 MG/ML IV BOLUS
INTRAVENOUS | Status: AC
  Filled 2022-10-06: qty 20

## 2022-10-06 MED ORDER — MIDAZOLAM HCL 5 MG/5ML IJ SOLN
INTRAMUSCULAR | Status: DC | PRN
  Administered 2022-10-06 (×2): 1 mg via INTRAVENOUS

## 2022-10-06 MED ORDER — BUPIVACAINE HCL (PF) 0.25 % IJ SOLN
INTRAMUSCULAR | Status: DC | PRN
  Administered 2022-10-06: 20 mL

## 2022-10-06 MED ORDER — ACETAMINOPHEN 500 MG PO TABS
ORAL_TABLET | ORAL | Status: AC
  Filled 2022-10-06: qty 2

## 2022-10-06 MED ORDER — ONDANSETRON HCL 4 MG/2ML IJ SOLN
INTRAMUSCULAR | Status: AC
  Filled 2022-10-06: qty 2

## 2022-10-06 MED ORDER — LACTATED RINGERS IV SOLN: INTRAVENOUS | Status: DC

## 2022-10-06 MED ORDER — OXYCODONE HCL 5 MG PO TABS: 5.0000 mg | ORAL_TABLET | Freq: Four times a day (QID) | ORAL | 0 refills | Status: DC | PRN

## 2022-10-06 SURGICAL SUPPLY — 40 items
APPLIER CLIP 9.375 MED OPEN (MISCELLANEOUS)
BLADE SURG 15 STRL LF DISP TIS (BLADE) ×1 IMPLANT
BLADE SURG 15 STRL SS (BLADE) ×1
CANISTER SUC SOCK COL 7IN (MISCELLANEOUS) ×1 IMPLANT
CANISTER SUCT 1200ML W/VALVE (MISCELLANEOUS) ×1 IMPLANT
CHLORAPREP W/TINT 26 (MISCELLANEOUS) ×1 IMPLANT
CLIP APPLIE 9.375 MED OPEN (MISCELLANEOUS) IMPLANT
COVER BACK TABLE 60X90IN (DRAPES) ×1 IMPLANT
COVER MAYO STAND STRL (DRAPES) ×1 IMPLANT
COVER PROBE CYLINDRICAL 5X96 (MISCELLANEOUS) ×1 IMPLANT
DERMABOND ADVANCED .7 DNX12 (GAUZE/BANDAGES/DRESSINGS) ×1 IMPLANT
DRAPE LAPAROSCOPIC ABDOMINAL (DRAPES) ×1 IMPLANT
DRAPE UTILITY XL STRL (DRAPES) ×1 IMPLANT
ELECT COATED BLADE 2.86 ST (ELECTRODE) ×1 IMPLANT
ELECT REM PT RETURN 9FT ADLT (ELECTROSURGICAL) ×1
ELECTRODE REM PT RTRN 9FT ADLT (ELECTROSURGICAL) ×1 IMPLANT
GLOVE BIO SURGEON STRL SZ7.5 (GLOVE) ×2 IMPLANT
GLOVE BIOGEL PI IND STRL 7.0 (GLOVE) IMPLANT
GLOVE ECLIPSE 6.5 STRL STRAW (GLOVE) IMPLANT
GOWN STRL REUS W/ TWL LRG LVL3 (GOWN DISPOSABLE) ×2 IMPLANT
GOWN STRL REUS W/TWL LRG LVL3 (GOWN DISPOSABLE) ×2
ILLUMINATOR WAVEGUIDE N/F (MISCELLANEOUS) IMPLANT
KIT MARKER MARGIN INK (KITS) ×1 IMPLANT
LIGHT WAVEGUIDE WIDE FLAT (MISCELLANEOUS) IMPLANT
NDL HYPO 25X1 1.5 SAFETY (NEEDLE) IMPLANT
NEEDLE HYPO 25X1 1.5 SAFETY (NEEDLE) ×1 IMPLANT
NS IRRIG 1000ML POUR BTL (IV SOLUTION) IMPLANT
PACK BASIN DAY SURGERY FS (CUSTOM PROCEDURE TRAY) ×1 IMPLANT
PENCIL SMOKE EVACUATOR (MISCELLANEOUS) ×1 IMPLANT
SLEEVE SCD COMPRESS KNEE MED (STOCKING) ×1 IMPLANT
SPIKE FLUID TRANSFER (MISCELLANEOUS) IMPLANT
SPONGE T-LAP 18X18 ~~LOC~~+RFID (SPONGE) ×1 IMPLANT
SUT MON AB 4-0 PC3 18 (SUTURE) ×1 IMPLANT
SUT SILK 2 0 SH (SUTURE) IMPLANT
SUT VICRYL 3-0 CR8 SH (SUTURE) ×1 IMPLANT
SYR CONTROL 10ML LL (SYRINGE) IMPLANT
TOWEL GREEN STERILE FF (TOWEL DISPOSABLE) ×1 IMPLANT
TRAY FAXITRON CT DISP (TRAY / TRAY PROCEDURE) ×1 IMPLANT
TUBE CONNECTING 20X1/4 (TUBING) ×1 IMPLANT
YANKAUER SUCT BULB TIP NO VENT (SUCTIONS) IMPLANT

## 2022-10-06 NOTE — Interval H&P Note (Signed)
History and Physical Interval Note:  10/06/2022 7:38 AM  Jill Conrad  has presented today for surgery, with the diagnosis of LEFT BREAST [name].  The various methods of treatment have been discussed with the patient and family. After consideration of risks, benefits and other options for treatment, the patient has consented to  Procedure(s): LEFT BREAST LUMPECTOMY WITH RADIOACTIVE SEED LOCALIZATION (Left) as a surgical intervention.  The patient's history has been reviewed, patient examined, no change in status, stable for surgery.  I have reviewed the patient's chart and labs.  Questions were answered to the patient's satisfaction.     [name]

## 2022-10-06 NOTE — Discharge Instructions (Addendum)
  Post Anesthesia Home Care Instructions  Activity: Get plenty of rest for the remainder of the day. A responsible individual must stay with you for 24 hours following the procedure.  For the next 24 hours, DO NOT: -Drive a car -Paediatric nurse -Drink alcoholic beverages -Take any medication unless instructed by your physician -Make any legal decisions or sign important papers.  Meals: Start with liquid foods such as gelatin or soup. Progress to regular foods as tolerated. Avoid greasy, spicy, heavy foods. If nausea and/or vomiting occur, drink only clear liquids until the nausea and/or vomiting subsides. Call your physician if vomiting continues.  Special Instructions/Symptoms: Your throat may feel dry or sore from the anesthesia or the breathing tube placed in your throat during surgery. If this causes discomfort, gargle with warm salt water. The discomfort should disappear within 24 hours.  If you had a scopolamine patch placed behind your ear for the management of post- operative nausea and/or vomiting:  1. The medication in the patch is effective for 72 hours, after which it should be removed.  Wrap patch in a tissue and discard in the trash. Wash hands thoroughly with soap and water. 2. You may remove the patch earlier than 72 hours if you experience unpleasant side effects which may include dry mouth, dizziness or visual disturbances. 3. Avoid touching the patch. Wash your hands with soap and water after contact with the patch.   Tylenol can be taken after 2pm

## 2022-10-06 NOTE — H&P (Signed)
REFERRING PHYSICIAN: Constant, Peggy, MD  PROVIDER: Landry Corporal, MD  MRN: [id number] DOB: 1971/05/23 Subjective   Chief Complaint: New Consultation (Left Atypical Hyperplasia )   History of Present Illness: Jill Conrad is a 52 y.o. female who is seen today as an office consultation for evaluation of New Consultation (Left Atypical Hyperplasia ) .   We are asked to see the patient in consultation by Dr. Vickii Chafe constant to evaluate her for atypical lobular hyperplasia of the left breast. The patient is a 52 year old Hispanic female who recently went for a routine screening mammogram. At that time she was found to have a 1.6 cm area of calcification in the upper outer quadrant of the left breast that was abnormal. This was biopsied and came back as atypical lobular hyperplasia. She is otherwise in pretty good health and does not smoke.  Review of Systems: A complete review of systems was obtained from the patient. I have reviewed this information and discussed as appropriate with the patient. See HPI as well for other ROS.  ROS   Medical History: History reviewed. No pertinent past medical history.  Patient Active Problem List  Diagnosis  Atypical ductal hyperplasia of left breast   History reviewed. No pertinent surgical history.   No Known Allergies  No current outpatient medications on file prior to visit.   No current facility-administered medications on file prior to visit.   History reviewed. No pertinent family history.   Social History   Tobacco Use  Smoking Status Not on file  Smokeless Tobacco Not on file    Social History   Socioeconomic History  Marital status: Married   Objective:   Vitals:  BP: 120/60  Pulse: 78  Temp: 36.3 C (97.3 F)  SpO2: 99%  Weight: (!) 111.1 kg (245 lb)  Height: 153.7 cm (5' 0.5")   Body mass index is 47.06 kg/m.  Physical Exam Vitals reviewed.  Constitutional:  General: She is not in acute  distress. Appearance: Normal appearance. She is obese.  HENT:  Head: Normocephalic and atraumatic.  Right Ear: External ear normal.  Left Ear: External ear normal.  Nose: Nose normal.  Mouth/Throat:  Mouth: Mucous membranes are moist.  Pharynx: Oropharynx is clear.  Eyes:  General: No scleral icterus. Extraocular Movements: Extraocular movements intact.  Conjunctiva/sclera: Conjunctivae normal.  Pupils: Pupils are equal, round, and reactive to light.  Cardiovascular:  Rate and Rhythm: Normal rate and regular rhythm.  Pulses: Normal pulses.  Heart sounds: Normal heart sounds.  Pulmonary:  Effort: Pulmonary effort is normal. No respiratory distress.  Breath sounds: Normal breath sounds.  Abdominal:  General: Bowel sounds are normal.  Palpations: Abdomen is soft.  Tenderness: There is no abdominal tenderness.  Musculoskeletal:  General: No swelling, tenderness or deformity. Normal range of motion.  Cervical back: Normal range of motion and neck supple.  Skin: General: Skin is warm and dry.  Coloration: Skin is not jaundiced.  Neurological:  General: No focal deficit present.  Mental Status: She is alert and oriented to person, place, and time.  Psychiatric:  Mood and Affect: Mood normal.  Behavior: Behavior normal.   Breast: There is no palpable mass in either breast. There is no palpable axillary, supraclavicular, or cervical lymphadenopathy.  Labs, Imaging and Diagnostic Testing:  Assessment and Plan:   Diagnoses and all orders for this visit:  Atypical ductal hyperplasia of left breast    The patient appears to have a 1.6 cm area of atypical lobular hyperplasia in the  upper outer quadrant of the left breast. Although this is benign it is considered a high risk lesion. The presence of this can increase her risk of breast cancer during her lifetime from about 10% to around 30%. Atypical lobular hyperplasia can also have an appearance very similar to preinvasive  cancer. Because of these 2 reasons my recommendation would be to have this area removed. I have discussed with her in detail the risks and benefits of the operation as well as some of the technical aspects including the use of a radioactive seed for localization and she understands and wishes to proceed. I will also refer her to the high risk clinic at the cancer center to talk about risk reduction. We will proceed with surgical planning.

## 2022-10-06 NOTE — Anesthesia Procedure Notes (Signed)
Procedure Name: LMA Insertion Date/Time: 10/06/2022 8:27 AM  Performed by: Bufford Spikes, CRNAPre-anesthesia Checklist: Patient identified, Emergency Drugs available, Suction available and Patient being monitored Patient Re-evaluated:Patient Re-evaluated prior to induction Oxygen Delivery Method: Circle system utilized Preoxygenation: Pre-oxygenation with 100% oxygen Induction Type: IV induction Ventilation: Mask ventilation without difficulty LMA: LMA inserted LMA Size: 4.0 Number of attempts: 1 Placement Confirmation: positive ETCO2 Tube secured with: Tape Dental Injury: Teeth and Oropharynx as per pre-operative assessment

## 2022-10-06 NOTE — Transfer of Care (Signed)
Immediate Anesthesia Transfer of Care Note  Patient: Jill Conrad  Procedure(s) Performed: LEFT BREAST LUMPECTOMY WITH RADIOACTIVE SEED LOCALIZATION (Left: Breast)  Patient Location: PACU  Anesthesia Type:General  Level of Consciousness: awake, alert , and oriented  Airway & Oxygen Therapy: Patient Spontanous Breathing and Patient connected to face mask oxygen  Post-op Assessment: Report given to RN and Post -op Vital signs reviewed and stable  Post vital signs: Reviewed and stable  Last Vitals:  Vitals Value Taken Time  BP 135/82 10/06/22 0912  Temp 36.4 C 10/06/22 0912  Pulse 83 10/06/22 0912  Resp 23 10/06/22 0912  SpO2 99 % 10/06/22 0912    Last Pain:  Vitals:   10/06/22 0802  TempSrc: Temporal  PainSc: 0-No pain         Complications: No notable events documented.

## 2022-10-06 NOTE — Op Note (Signed)
10/06/2022  9:06 AM  PATIENT:  Jill Conrad  52 y.o. female  PRE-OPERATIVE DIAGNOSIS:  LEFT BREAST ALH  POST-OPERATIVE DIAGNOSIS:  LEFT BREAST ALH  PROCEDURE:  Procedure(s): LEFT BREAST LUMPECTOMY WITH RADIOACTIVE SEED LOCALIZATION (Left)  SURGEON:  Surgeon(s) and Role:    Jovita Kussmaul, MD - Primary  PHYSICIAN ASSISTANT:   ASSISTANTS: none   ANESTHESIA:   local and general  EBL:  10 mL   BLOOD ADMINISTERED:none  DRAINS: none   LOCAL MEDICATIONS USED:  MARCAINE     SPECIMEN:  Source of Specimen:  left breast tissue  DISPOSITION OF SPECIMEN:  PATHOLOGY  COUNTS:  YES  TOURNIQUET:  * No tourniquets in log *  DICTATION: .Dragon Dictation  After informed consent was obtained the patient was brought to the operating room and placed in the supine position on the operating table.  After adequate induction of general anesthesia the patient's left breast was prepped with ChloraPrep, allowed to dry, and draped in usual sterile manner.  An appropriate timeout was performed.  Previously an I-125 seed was placed in the upper outer left breast to mark an area of atypical lobular hyperplasia.  The neoprobe was set to I-125 in the area of radioactivity was readily identified.  The area around this was infiltrated with quarter percent Marcaine.  A curvilinear incision was made along the upper outer edge of the areola of the left breast with a 15 blade knife.  The incision was carried through the skin and subcutaneous tissue sharply with the electrocautery.  Dissection was then carried towards the radioactive seed under the direction of the neoprobe.  Once I more closely approach the radioactive seed I then removed a circular portion of breast tissue sharply with the electrocautery around the radioactive seed while checking the area of radioactivity frequently.  Once the tissue was removed it was oriented with the appropriate paint colors.  A specimen radiograph was obtained that  showed the clip and seed to be near the center of the specimen.  The specimen was then sent to pathology for further evaluation.  Hemostasis was achieved using the Bovie electrocautery.  The wound was irrigated with saline and infiltrated with more quarter percent Marcaine.  The deep layer of the incision was then closed with layers of interrupted 3-0 Vicryl stitches.  The skin was then closed with interrupted 4-0 Monocryl subcuticular stitches.  Dermabond dressings were applied.  The patient tolerated the procedure well.  At the end of the case all needle sponge and instrument counts were correct.  The patient was then awakened and taken to recovery in stable condition.  PLAN OF CARE: Discharge to home after PACU  PATIENT DISPOSITION:  PACU - hemodynamically stable.   Delay start of Pharmacological VTE agent (>24hrs) due to surgical blood loss or risk of bleeding: not applicable

## 2022-10-07 NOTE — Anesthesia Postprocedure Evaluation (Signed)
Anesthesia Post Note  Patient: Jill Conrad  Procedure(s) Performed: LEFT BREAST LUMPECTOMY WITH RADIOACTIVE SEED LOCALIZATION (Left: Breast)     Patient location during evaluation: PACU Anesthesia Type: General Level of consciousness: sedated and patient cooperative Pain management: pain level controlled Vital Signs Assessment: post-procedure vital signs reviewed and stable Respiratory status: spontaneous breathing Cardiovascular status: stable Anesthetic complications: no   No notable events documented.  Last Vitals:  Vitals:   10/06/22 0930 10/06/22 0941  BP:  (!) 141/85  Pulse: 71 75  Resp: 19 20  Temp:  (!) 36.3 C  SpO2: 99% 98%    Last Pain:  Vitals:   10/06/22 0941  TempSrc:   PainSc: 0-No pain                 Nolon Nations

## 2022-10-09 ENCOUNTER — Encounter (HOSPITAL_BASED_OUTPATIENT_CLINIC_OR_DEPARTMENT_OTHER): Payer: Self-pay | Admitting: General Surgery

## 2022-10-09 LAB — SURGICAL PATHOLOGY

## 2022-10-12 ENCOUNTER — Encounter (HOSPITAL_COMMUNITY): Payer: Self-pay

## 2022-10-20 ENCOUNTER — Inpatient Hospital Stay
Payer: No Typology Code available for payment source | Attending: Hematology and Oncology | Admitting: Hematology and Oncology

## 2022-10-20 ENCOUNTER — Inpatient Hospital Stay: Payer: No Typology Code available for payment source

## 2022-10-20 ENCOUNTER — Other Ambulatory Visit: Payer: Self-pay

## 2022-10-20 VITALS — BP 136/56 | HR 76 | Temp 97.5°F | Resp 16 | Ht 61.0 in | Wt 244.9 lb

## 2022-10-20 DIAGNOSIS — N6092 Unspecified benign mammary dysplasia of left breast: Secondary | ICD-10-CM | POA: Insufficient documentation

## 2022-10-20 DIAGNOSIS — N6099 Unspecified benign mammary dysplasia of unspecified breast: Secondary | ICD-10-CM

## 2022-10-20 DIAGNOSIS — I10 Essential (primary) hypertension: Secondary | ICD-10-CM | POA: Insufficient documentation

## 2022-10-20 LAB — CMP (CANCER CENTER ONLY)
ALT: 27 U/L (ref 0–44)
AST: 19 U/L (ref 15–41)
Albumin: 4.6 g/dL (ref 3.5–5.0)
Alkaline Phosphatase: 99 U/L (ref 38–126)
Anion gap: 9 (ref 5–15)
BUN: 20 mg/dL (ref 6–20)
CO2: 30 mmol/L (ref 22–32)
Calcium: 10.5 mg/dL — ABNORMAL HIGH (ref 8.9–10.3)
Chloride: 100 mmol/L (ref 98–111)
Creatinine: 0.64 mg/dL (ref 0.44–1.00)
GFR, Estimated: >60 mL/min (ref >60)
Glucose, Bld: 130 mg/dL — ABNORMAL HIGH (ref 70–99)
Potassium: 3.8 mmol/L (ref 3.5–5.1)
Sodium: 139 mmol/L (ref 135–145)
Total Bilirubin: 0.4 mg/dL (ref 0.3–1.2)
Total Protein: 8.1 g/dL (ref 6.5–8.1)

## 2022-10-20 LAB — CBC WITH DIFFERENTIAL (CANCER CENTER ONLY)
Abs Immature Granulocytes: 0.03 10*3/uL (ref 0.00–0.07)
Basophils Absolute: 0.1 10*3/uL (ref 0.0–0.1)
Basophils Relative: 1 %
Eosinophils Absolute: 0.2 10*3/uL (ref 0.0–0.5)
Eosinophils Relative: 2 %
HCT: 38.1 % (ref 36.0–46.0)
Hemoglobin: 12.8 g/dL (ref 12.0–15.0)
Immature Granulocytes: 0 %
Lymphocytes Relative: 28 %
Lymphs Abs: 2.8 10*3/uL (ref 0.7–4.0)
MCH: 28.7 pg (ref 26.0–34.0)
MCHC: 33.6 g/dL (ref 30.0–36.0)
MCV: 85.4 fL (ref 80.0–100.0)
Monocytes Absolute: 0.6 10*3/uL (ref 0.1–1.0)
Monocytes Relative: 6 %
Neutro Abs: 6.1 10*3/uL (ref 1.7–7.7)
Neutrophils Relative %: 63 %
Platelet Count: 404 10*3/uL — ABNORMAL HIGH (ref 150–400)
RBC: 4.46 MIL/uL (ref 3.87–5.11)
RDW: 13 % (ref 11.5–15.5)
WBC Count: 9.7 10*3/uL (ref 4.0–10.5)
nRBC: 0 % (ref 0.0–0.2)

## 2022-10-20 NOTE — Progress Notes (Signed)
[facility] NOTE  Patient Care Team: Andreas Blower, MD as PCP - General (Family Medicine)  CHIEF COMPLAINTS/PURPOSE OF CONSULTATION:  Newly diagnosed breast cancer  ASSESSMENT AND PLAN:   No problem-specific Assessment & Plan notes found for this encounter.    HISTORY OF PRESENTING ILLNESS:  Jill Conrad 52 y.o. female is here because of recent diagnosis of left breast calcifications.  Patient has screening mammogram and was recalled from screening.  Diagnostic mammogram showed indeterminate left breast calcification and probably benign bilateral breast masses.  Ultrasound confirmed the above-mentioned findings. She had left breast needle core biopsy upper outer region which showed focal atypical lobular hyperplasia and fibrocystic changes with calcifications.   She has 4 child, 64 years old.  She is compliant with all her medications.  Rest of the pertinent 10 point ROS reviewed and negative  MEDICAL HISTORY:  Past Medical History:  Diagnosis Date   GERD (gastroesophageal reflux disease)    High cholesterol    Hypertension     SURGICAL HISTORY: Past Surgical History:  Procedure Laterality Date   BREAST BIOPSY Left 06/23/2022   MM LT BREAST BX W LOC DEV 1ST LESION IMAGE BX SPEC STEREO GUIDE 06/23/2022 [facility]   BREAST BIOPSY  10/05/2022   MM LT RADIOACTIVE SEED LOC MAMMO GUIDE 10/05/2022 [facility]   BREAST LUMPECTOMY WITH RADIOACTIVE SEED LOCALIZATION Left 10/06/2022   Procedure: LEFT BREAST LUMPECTOMY WITH RADIOACTIVE SEED LOCALIZATION;  Surgeon: Jovita Kussmaul, MD;  Location: [facility];  Service: General;  Laterality: Left;    SOCIAL HISTORY: Social History   Socioeconomic History   Marital status: Married    Spouse name: Not on file   Number of children: 1   Years of education: Not on file   Highest education level: 5th grade  Occupational History   Not on file  Tobacco Use   Smoking  status: Never   Smokeless tobacco: Never  Vaping Use   Vaping Use: Never used  Substance and Sexual Activity   Alcohol use: No   Drug use: No   Sexual activity: Yes    Birth control/protection: Post-menopausal  Other Topics Concern   Not on file  Social History Narrative   Not on file   Social Determinants of Health   Financial Resource Strain: Not on file  Food Insecurity: No Food Insecurity (02/16/2022)   Hunger Vital Sign    Worried About Running Out of Food in the Last Year: Never true    Ran Out of Food in the Last Year: Never true  Transportation Needs: No Transportation Needs (02/16/2022)   PRAPARE - Hydrologist (Medical): No    Lack of Transportation (Non-Medical): No  Physical Activity: Not on file  Stress: Not on file  Social Connections: Not on file  Intimate Partner Violence: Not on file    FAMILY HISTORY: Family History  Problem Relation Age of Onset   Breast cancer Neg Hx     ALLERGIES:  has No Known Allergies.  MEDICATIONS:  Current Outpatient Medications  Medication Sig Dispense Refill   atorvastatin (LIPITOR) 20 MG tablet Take 20 mg by mouth daily.     esomeprazole (NEXIUM) 40 MG capsule Take 40 mg by mouth daily.     lisinopril (ZESTRIL) 20 MG tablet Take 20 mg by mouth daily.     ondansetron (ZOFRAN) 4 MG tablet Take 4 mg by mouth 2 (two) times daily as needed for nausea  or vomiting.     oxyCODONE (ROXICODONE) 5 MG immediate release tablet Take 1 tablet (5 mg total) by mouth every 6 (six) hours as needed for severe pain. 10 tablet 0   pantoprazole (PROTONIX) 20 MG tablet Take 20 mg by mouth daily.     No current facility-administered medications for this visit.    REVIEW OF SYSTEMS:   Constitutional: Denies fevers, chills or abnormal night sweats Eyes: Denies blurriness of vision, double vision or watery eyes Ears, nose, mouth, throat, and face: Denies mucositis or sore throat Respiratory: Denies cough, dyspnea or  wheezes Cardiovascular: Denies palpitation, chest discomfort or lower extremity swelling Gastrointestinal:  Denies nausea, heartburn or change in bowel habits Skin: Denies abnormal skin rashes Lymphatics: Denies new lymphadenopathy or easy bruising Neurological:Denies numbness, tingling or new weaknesses Behavioral/Psych: Mood is stable, no new changes  Breast: Denies any palpable lumps or discharge All other systems were reviewed with the patient and are negative.  PHYSICAL EXAMINATION: ECOG PERFORMANCE STATUS: 0 - Asymptomatic  There were no vitals filed for this visit.  There were no vitals filed for this visit.   GENERAL:alert, no distress and comfortable NECK: supple, thyroid normal size, non-tender, without nodularity Chest: CTA bilaterally Heart: RRR Abdomen: Soft, non tender, non distended. PSYCH: alert & oriented x 3 with fluent speech NEURO: no focal motor/sensory deficits  LABORATORY DATA:  I have reviewed the data as listed Lab Results  Component Value Date   WBC 8.2 11/04/2021   HGB 12.4 11/04/2021   HCT 37.8 11/04/2021   MCV 86.1 11/04/2021   PLT 272 11/04/2021   Lab Results  Component Value Date   NA 138 11/04/2021   K 4.2 11/04/2021   CL 103 11/04/2021   CO2 27 11/04/2021    RADIOGRAPHIC STUDIES: I have personally reviewed the radiological reports and agreed with the findings in the report.  Total time spent: 45 minutes including history, physical exam, review of records, counseling and coordination of care All questions were answered. The patient knows to call the clinic with any problems, questions or concerns.    Benay Pike, MD 10/20/22

## 2022-10-23 ENCOUNTER — Telehealth: Payer: Self-pay | Admitting: Hematology and Oncology

## 2022-10-23 NOTE — Assessment & Plan Note (Signed)
This is a 52 year old female patient with newly diagnosed atypical lobular hyperplasia referred to breast oncology for recommendations.  We have discussed the following details about atypical lobular hyperplasia and role of tamoxifen.  She is now status post lumpectomy and final pathology showed atypical lobular hyperplasia.  We once again discussed about antiestrogen therapy with tamoxifen.  We have discussed about mechanism of action, adverse effects of tamoxifen including but not limited to postmenopausal symptoms, increased risk of DVT/PE, endometrial hyperplasia and endometrial carcinoma.  Benefit of tamoxifen would be improvement in bone density.  She may also be a candidate for aromatase inhibitors if she is postmenopausal.  I ordered LH, FSH and estradiol but these have not been drawn.  She is willing to try it.  She can also consider screening with mammograms alternating with MRIs.

## 2022-10-23 NOTE — Telephone Encounter (Signed)
Spoke with patient confirming upcoming appointment  

## 2022-10-27 ENCOUNTER — Telehealth: Payer: Self-pay | Admitting: Hematology and Oncology

## 2022-10-27 NOTE — Telephone Encounter (Signed)
Spoke with patient confirming upcoming appointments  

## 2022-10-30 ENCOUNTER — Other Ambulatory Visit: Payer: No Typology Code available for payment source

## 2022-11-02 ENCOUNTER — Other Ambulatory Visit: Payer: Self-pay | Admitting: *Deleted

## 2022-11-02 DIAGNOSIS — N6099 Unspecified benign mammary dysplasia of unspecified breast: Secondary | ICD-10-CM

## 2022-11-03 ENCOUNTER — Inpatient Hospital Stay: Payer: No Typology Code available for payment source

## 2022-11-03 ENCOUNTER — Other Ambulatory Visit: Payer: Self-pay

## 2022-11-03 DIAGNOSIS — N6099 Unspecified benign mammary dysplasia of unspecified breast: Secondary | ICD-10-CM

## 2022-11-03 LAB — CMP (CANCER CENTER ONLY)
ALT: 26 U/L (ref 0–44)
AST: 19 U/L (ref 15–41)
Albumin: 4.2 g/dL (ref 3.5–5.0)
Alkaline Phosphatase: 84 U/L (ref 38–126)
Anion gap: 6 (ref 5–15)
BUN: 15 mg/dL (ref 6–20)
CO2: 30 mmol/L (ref 22–32)
Calcium: 9.7 mg/dL (ref 8.9–10.3)
Chloride: 102 mmol/L (ref 98–111)
Creatinine: 0.54 mg/dL (ref 0.44–1.00)
GFR, Estimated: >60 mL/min (ref >60)
Glucose, Bld: 104 mg/dL — ABNORMAL HIGH (ref 70–99)
Potassium: 3.9 mmol/L (ref 3.5–5.1)
Sodium: 138 mmol/L (ref 135–145)
Total Bilirubin: 0.4 mg/dL (ref 0.3–1.2)
Total Protein: 7.3 g/dL (ref 6.5–8.1)

## 2022-11-03 LAB — CBC WITH DIFFERENTIAL (CANCER CENTER ONLY)
Abs Immature Granulocytes: 0.03 10*3/uL (ref 0.00–0.07)
Basophils Absolute: 0.1 10*3/uL (ref 0.0–0.1)
Basophils Relative: 1 %
Eosinophils Absolute: 0.2 10*3/uL (ref 0.0–0.5)
Eosinophils Relative: 2 %
HCT: 33.7 % — ABNORMAL LOW (ref 36.0–46.0)
Hemoglobin: 11.6 g/dL — ABNORMAL LOW (ref 12.0–15.0)
Immature Granulocytes: 0 %
Lymphocytes Relative: 31 %
Lymphs Abs: 2.6 10*3/uL (ref 0.7–4.0)
MCH: 29.4 pg (ref 26.0–34.0)
MCHC: 34.4 g/dL (ref 30.0–36.0)
MCV: 85.3 fL (ref 80.0–100.0)
Monocytes Absolute: 0.6 10*3/uL (ref 0.1–1.0)
Monocytes Relative: 7 %
Neutro Abs: 5 10*3/uL (ref 1.7–7.7)
Neutrophils Relative %: 59 %
Platelet Count: 295 10*3/uL (ref 150–400)
RBC: 3.95 MIL/uL (ref 3.87–5.11)
RDW: 12.8 % (ref 11.5–15.5)
WBC Count: 8.4 10*3/uL (ref 4.0–10.5)
nRBC: 0 % (ref 0.0–0.2)

## 2022-11-06 ENCOUNTER — Inpatient Hospital Stay: Payer: No Typology Code available for payment source | Admitting: Hematology and Oncology

## 2022-11-14 ENCOUNTER — Telehealth: Payer: No Typology Code available for payment source | Admitting: Hematology and Oncology

## 2022-11-21 ENCOUNTER — Telehealth: Payer: No Typology Code available for payment source | Admitting: Hematology and Oncology

## 2022-11-24 ENCOUNTER — Inpatient Hospital Stay: Payer: No Typology Code available for payment source

## 2022-11-24 ENCOUNTER — Other Ambulatory Visit: Payer: Self-pay | Admitting: *Deleted

## 2022-11-24 ENCOUNTER — Inpatient Hospital Stay
Payer: No Typology Code available for payment source | Attending: Hematology and Oncology | Admitting: Hematology and Oncology

## 2022-11-24 VITALS — BP 94/45 | HR 76 | Temp 97.7°F | Resp 16 | Ht 61.0 in | Wt 244.8 lb

## 2022-11-24 DIAGNOSIS — I10 Essential (primary) hypertension: Secondary | ICD-10-CM | POA: Insufficient documentation

## 2022-11-24 DIAGNOSIS — N6099 Unspecified benign mammary dysplasia of unspecified breast: Secondary | ICD-10-CM

## 2022-11-24 DIAGNOSIS — N6092 Unspecified benign mammary dysplasia of left breast: Secondary | ICD-10-CM | POA: Insufficient documentation

## 2022-11-24 NOTE — Progress Notes (Unsigned)
[facility] CONSULT NOTE  Patient Care Team: Tula NakayamaKaram, Phillip Jerome, MD as PCP - General (Family Medicine)  CHIEF COMPLAINTS/PURPOSE OF CONSULTATION:  Newly diagnosed breast cancer  ASSESSMENT AND PLAN:   No problem-specific Assessment & Plan notes found for this encounter.   Unfortunately her postmenopausal labs were not drawn today for unclear reason.  We will try to bring her in for additional labs.  I also sent an in basket message to our scheduler to schedule her for televisit in about 1 to 2 weeks to follow-up on this.  HISTORY OF PRESENTING ILLNESS:  Jill Conrad 52 y.o. female is here because of recent diagnosis of left breast calcifications.  Patient has screening mammogram and was recalled from screening.  Diagnostic mammogram showed indeterminate left breast calcification and probably benign bilateral breast masses.  Ultrasound confirmed the above-mentioned findings. She had left breast needle core biopsy upper outer region which showed focal atypical lobular hyperplasia and fibrocystic changes with calcifications.  She was very late to the appointment. She had certified spanish interpretor present for the entirety of conversation.  She tells me that she is healing very well.  She denies any complaints today. Rest of the pertinent 10 point ROS reviewed and negative  MEDICAL HISTORY:  Past Medical History:  Diagnosis Date   GERD (gastroesophageal reflux disease)    High cholesterol    Hypertension     SURGICAL HISTORY: Past Surgical History:  Procedure Laterality Date   BREAST BIOPSY Left 06/23/2022   MM LT BREAST BX W LOC DEV 1ST LESION IMAGE BX SPEC STEREO GUIDE 06/23/2022 [facility]   BREAST BIOPSY  10/05/2022   MM LT RADIOACTIVE SEED LOC MAMMO GUIDE 10/05/2022 [facility]   BREAST LUMPECTOMY WITH RADIOACTIVE SEED LOCALIZATION Left 10/06/2022   Procedure: LEFT BREAST LUMPECTOMY WITH RADIOACTIVE SEED LOCALIZATION;  Surgeon: Griselda Mineroth,  Paul III, MD;  Location: [facility];  Service: General;  Laterality: Left;    SOCIAL HISTORY: Social History   Socioeconomic History   Marital status: Married    Spouse name: Not on file   Number of children: 1   Years of education: Not on file   Highest education level: 5th grade  Occupational History   Not on file  Tobacco Use   Smoking status: Never   Smokeless tobacco: Never  Vaping Use   Vaping Use: Never used  Substance and Sexual Activity   Alcohol use: No   Drug use: No   Sexual activity: Yes    Birth control/protection: Post-menopausal  Other Topics Concern   Not on file  Social History Narrative   Not on file   Social Determinants of Health   Financial Resource Strain: Not on file  Food Insecurity: No Food Insecurity (02/16/2022)   Hunger Vital Sign    Worried About Running Out of Food in the Last Year: Never true    Ran Out of Food in the Last Year: Never true  Transportation Needs: No Transportation Needs (02/16/2022)   PRAPARE - Administrator, Civil ServiceTransportation    Lack of Transportation (Medical): No    Lack of Transportation (Non-Medical): No  Physical Activity: Not on file  Stress: Not on file  Social Connections: Not on file  Intimate Partner Violence: Not on file    FAMILY HISTORY: Family History  Problem Relation Age of Onset   Breast cancer Neg Hx     ALLERGIES:  has No Known Allergies.  MEDICATIONS:  Current Outpatient Medications  Medication Sig Dispense Refill  atorvastatin (LIPITOR) 20 MG tablet Take 20 mg by mouth daily.     esomeprazole (NEXIUM) 40 MG capsule Take 40 mg by mouth daily.     lisinopril (ZESTRIL) 20 MG tablet Take 20 mg by mouth daily.     ondansetron (ZOFRAN) 4 MG tablet Take 4 mg by mouth 2 (two) times daily as needed for nausea or vomiting.     oxyCODONE (ROXICODONE) 5 MG immediate release tablet Take 1 tablet (5 mg total) by mouth every 6 (six) hours as needed for severe pain. 10 tablet 0   pantoprazole (PROTONIX) 20  MG tablet Take 20 mg by mouth daily.     No current facility-administered medications for this visit.    REVIEW OF SYSTEMS:   Constitutional: Denies fevers, chills or abnormal night sweats Eyes: Denies blurriness of vision, double vision or watery eyes Ears, nose, mouth, throat, and face: Denies mucositis or sore throat Respiratory: Denies cough, dyspnea or wheezes Cardiovascular: Denies palpitation, chest discomfort or lower extremity swelling Gastrointestinal:  Denies nausea, heartburn or change in bowel habits Skin: Denies abnormal skin rashes Lymphatics: Denies new lymphadenopathy or easy bruising Neurological:Denies numbness, tingling or new weaknesses Behavioral/Psych: Mood is stable, no new changes  Breast: Denies any palpable lumps or discharge All other systems were reviewed with the patient and are negative.  PHYSICAL EXAMINATION: ECOG PERFORMANCE STATUS: 0 - Asymptomatic  Vitals:   11/24/22 1401  BP: (!) 94/45  Pulse: 76  Resp: 16  Temp: 97.7 F (36.5 C)  SpO2: 100%    Filed Weights   11/24/22 1401  Weight: 244 lb 12.8 oz (111 kg)   PE deferred in lieu of counseling  LABORATORY DATA:  I have reviewed the data as listed Lab Results  Component Value Date   WBC 8.4 11/03/2022   HGB 11.6 (L) 11/03/2022   HCT 33.7 (L) 11/03/2022   MCV 85.3 11/03/2022   PLT 295 11/03/2022   Lab Results  Component Value Date   NA 138 11/03/2022   K 3.9 11/03/2022   CL 102 11/03/2022   CO2 30 11/03/2022    RADIOGRAPHIC STUDIES: I have personally reviewed the radiological reports and agreed with the findings in the report.  Total time spent: 30 minutes including history, physical exam, review of records, counseling and coordination of care All questions were answered. The patient knows to call the clinic with any problems, questions or concerns.    Rachel Moulds, MD 11/24/22

## 2022-11-25 LAB — ESTRADIOL: Estradiol: 18.7 pg/mL

## 2022-11-25 LAB — FOLLICLE STIMULATING HORMONE: FSH: 40.9 m[IU]/mL

## 2022-11-25 LAB — LUTEINIZING HORMONE: LH: 26.6 m[IU]/mL

## 2022-11-26 MED ORDER — ANASTROZOLE 1 MG PO TABS: 1.0000 mg | ORAL_TABLET | Freq: Every day | ORAL | 3 refills | Status: AC

## 2022-12-07 ENCOUNTER — Telehealth: Payer: Self-pay | Admitting: *Deleted

## 2022-12-07 NOTE — Telephone Encounter (Signed)
This RN attempted to contact pt to discuss need to start medication Arimidex via [employer]-  Upon calling received message that pt's VM is full and cannot leave message.  Per review pt does not have MyChart established.  This office will try again at a later time to call.

## 2022-12-15 ENCOUNTER — Telehealth: Payer: Self-pay | Admitting: *Deleted

## 2022-12-15 NOTE — Telephone Encounter (Signed)
See other entry 

## 2022-12-15 NOTE — Telephone Encounter (Signed)
This RN called pt's pharmacy to see if pt has picked up the medication MD sent in and a VM left via [employer] was left 1 week ago.  Per pharmacy prescription has not been filled or picked up.  This RN requested prescription to be filled.  This RN then attempted to contact pt again thru [employer]- and per cell number obtained VM- message left informing pt medication has been called in that patient needs to pick and start. This RN's name given.  Attempted also to reach pt per other number given with call being directed to recording stating pt does not have VM set up.  This RN checked contact list and noted number same as patient's.  This RN will attempt to follow up next week for verification of communication.  This note will be forwarded to MD for review of communication- no further needs at this time.

## 2023-04-20 ENCOUNTER — Emergency Department (HOSPITAL_COMMUNITY): Payer: Self-pay

## 2023-04-20 ENCOUNTER — Emergency Department (HOSPITAL_COMMUNITY)
Admission: EM | Admit: 2023-04-20 | Discharge: 2023-04-20 | Disposition: A | Discharge status: Home / Self Care | Payer: Self-pay | Attending: Emergency Medicine | Admitting: Emergency Medicine

## 2023-04-20 ENCOUNTER — Inpatient Hospital Stay
Admission: RE | Admit: 2023-04-20 | Discharge: 2023-04-20 | Disposition: A | Discharge status: Home / Self Care | Payer: Self-pay | Source: Ambulatory Visit | Attending: Hematology and Oncology | Admitting: Hematology and Oncology

## 2023-04-20 ENCOUNTER — Other Ambulatory Visit: Payer: Self-pay

## 2023-04-20 ENCOUNTER — Encounter (HOSPITAL_COMMUNITY): Payer: Self-pay

## 2023-04-20 DIAGNOSIS — Z79899 Other long term (current) drug therapy: Secondary | ICD-10-CM | POA: Insufficient documentation

## 2023-04-20 DIAGNOSIS — I10 Essential (primary) hypertension: Secondary | ICD-10-CM | POA: Insufficient documentation

## 2023-04-20 DIAGNOSIS — R112 Nausea with vomiting, unspecified: Secondary | ICD-10-CM | POA: Insufficient documentation

## 2023-04-20 DIAGNOSIS — G43809 Other migraine, not intractable, without status migrainosus: Secondary | ICD-10-CM | POA: Insufficient documentation

## 2023-04-20 DIAGNOSIS — R1013 Epigastric pain: Secondary | ICD-10-CM | POA: Insufficient documentation

## 2023-04-20 LAB — COMPREHENSIVE METABOLIC PANEL
ALT: 29 U/L (ref 0–44)
AST: 20 U/L (ref 15–41)
Albumin: 4 g/dL (ref 3.5–5.0)
Alkaline Phosphatase: 95 U/L (ref 38–126)
Anion gap: 8 (ref 5–15)
BUN: 16 mg/dL (ref 6–20)
CO2: 28 mmol/L (ref 22–32)
Calcium: 9.5 mg/dL (ref 8.9–10.3)
Chloride: 102 mmol/L (ref 98–111)
Creatinine, Ser: 0.62 mg/dL (ref 0.44–1.00)
GFR, Estimated: >60 mL/min (ref >60)
Glucose, Bld: 103 mg/dL — ABNORMAL HIGH (ref 70–99)
Potassium: 3.6 mmol/L (ref 3.5–5.1)
Sodium: 138 mmol/L (ref 135–145)
Total Bilirubin: 0.5 mg/dL (ref 0.3–1.2)
Total Protein: 7.8 g/dL (ref 6.5–8.1)

## 2023-04-20 LAB — CBC
HCT: 40.3 % (ref 36.0–46.0)
Hemoglobin: 13 g/dL (ref 12.0–15.0)
MCH: 27.7 pg (ref 26.0–34.0)
MCHC: 32.3 g/dL (ref 30.0–36.0)
MCV: 85.7 fL (ref 80.0–100.0)
Platelets: 325 10*3/uL (ref 150–400)
RBC: 4.7 MIL/uL (ref 3.87–5.11)
RDW: 13.6 % (ref 11.5–15.5)
WBC: 9.3 10*3/uL (ref 4.0–10.5)
nRBC: 0 % (ref 0.0–0.2)

## 2023-04-20 LAB — URINALYSIS, ROUTINE W REFLEX MICROSCOPIC
Bacteria, UA: NONE SEEN
Bilirubin Urine: NEGATIVE
Glucose, UA: NEGATIVE mg/dL
Ketones, ur: NEGATIVE mg/dL
Leukocytes,Ua: NEGATIVE
Nitrite: NEGATIVE
Protein, ur: NEGATIVE mg/dL
Specific Gravity, Urine: 1.003 — ABNORMAL LOW (ref 1.005–1.030)
pH: 8 (ref 5.0–8.0)

## 2023-04-20 LAB — LIPASE, BLOOD: Lipase: 33 U/L (ref 11–51)

## 2023-04-20 MED ORDER — PROCHLORPERAZINE EDISYLATE 10 MG/2ML IJ SOLN
10.0000 mg | Freq: Once | INTRAMUSCULAR | Status: AC
  Administered 2023-04-20: 10 mg via INTRAVENOUS
  Filled 2023-04-20: qty 2

## 2023-04-20 MED ORDER — ESOMEPRAZOLE MAGNESIUM 40 MG PO CPDR: 40.0000 mg | DELAYED_RELEASE_CAPSULE | Freq: Every day | ORAL | 0 refills | Status: AC

## 2023-04-20 MED ORDER — DIPHENHYDRAMINE HCL 50 MG/ML IJ SOLN
12.5000 mg | Freq: Once | INTRAMUSCULAR | Status: AC
  Administered 2023-04-20: 12.5 mg via INTRAVENOUS
  Filled 2023-04-20: qty 1

## 2023-04-20 MED ORDER — SODIUM CHLORIDE 0.9 % IV BOLUS
1000.0000 mL | Freq: Once | INTRAVENOUS | Status: AC
  Administered 2023-04-20: 1000 mL via INTRAVENOUS

## 2023-04-20 MED ORDER — IOHEXOL 300 MG/ML  SOLN
100.0000 mL | Freq: Once | INTRAMUSCULAR | Status: AC | PRN
  Administered 2023-04-20: 100 mL via INTRAVENOUS

## 2023-04-20 MED ORDER — ONDANSETRON 4 MG PO TBDP: 4.0000 mg | ORAL_TABLET | Freq: Three times a day (TID) | ORAL | 0 refills | Status: AC | PRN

## 2023-04-20 NOTE — Discharge Instructions (Addendum)
Follow-up with your primary care doctor in 1 to 2 weeks.  Please return if your symptoms worsen.  I have restarted you on your Nexium.  Take Zofran as needed for nausea and vomiting.

## 2023-04-20 NOTE — ED Triage Notes (Addendum)
Pt is coming in today for progressively worse headaches. Pt states they have been going on for a couple of days, is sensitive to light. Pt also endorsing N/V after eating, with abd pain. Pt has had a recent colonoscopy.

## 2023-04-20 NOTE — ED Provider Notes (Signed)
[place] [facility]ERGENCY DEPARTMENT AT [facility] Provider Note   CSN: [id number] Arrival date & time: 04/20/23  1432     History  Chief Complaint  Patient presents with   Headache   Emesis    Jill Conrad is a 52 y.o. female.  Patient here with migraine type headache for the last few days some upper abdominal pain with some nausea and vomiting after eating at times.  Had recent endoscopy few months ago and was told she had stomach acid.  She takes currently famotidine and Tums for this.  She has history of headaches.  She denies any major abdominal surgery.  She denies any diarrhea.  She denies any fever chills or neck pain.  Nothing makes it worse or better.  The history is provided by the patient.       Home Medications Prior to Admission medications   Medication Sig Start Date End Date Taking? Authorizing Provider  ondansetron (ZOFRAN-ODT) 4 MG disintegrating tablet Take 1 tablet (4 mg total) by mouth every 8 (eight) hours as needed for nausea or vomiting. 04/20/23  Yes Dariann Huckaba, DO  anastrozole (ARIMIDEX) 1 MG tablet Take 1 tablet (1 mg total) by mouth daily. 11/26/22   Rachel Moulds, MD  atorvastatin (LIPITOR) 20 MG tablet Take 20 mg by mouth daily. 10/15/20   [provider]  esomeprazole (NEXIUM) 40 MG capsule Take 1 capsule (40 mg total) by mouth daily for 14 doses. 04/20/23 05/04/23  Daveigh Batty, DO  lisinopril (ZESTRIL) 20 MG tablet Take 20 mg by mouth daily. 10/22/21   [provider]  ondansetron (ZOFRAN) 4 MG tablet Take 4 mg by mouth 2 (two) times daily as needed for nausea or vomiting. 11/01/21   [provider]  oxyCODONE (ROXICODONE) 5 MG immediate release tablet Take 1 tablet (5 mg total) by mouth every 6 (six) hours as needed for severe pain. 10/06/22   Chevis Pretty III, MD  pantoprazole (PROTONIX) 20 MG tablet Take 20 mg by mouth daily. 11/10/21   [provider]      Allergies    Patient has no known  allergies.    Review of Systems   Review of Systems  Physical Exam Updated Vital Signs BP (!) 159/84   Pulse 67   Temp 98.5 F (36.9 C)   Resp 17   Wt 81.6 kg   LMP 10/20/2020   SpO2 100%   BMI 34.01 kg/m  Physical Exam Vitals and nursing note reviewed.  Constitutional:      General: She is not in acute distress.    Appearance: She is well-developed.  HENT:     Head: Normocephalic and atraumatic.     Mouth/Throat:     Mouth: Mucous membranes are moist.     Pharynx: Oropharynx is clear.  Eyes:     Extraocular Movements: Extraocular movements intact.     Conjunctiva/sclera: Conjunctivae normal.     Pupils: Pupils are equal, round, and reactive to light.  Cardiovascular:     Rate and Rhythm: Normal rate and regular rhythm.     Heart sounds: No murmur heard. Pulmonary:     Effort: Pulmonary effort is normal. No respiratory distress.     Breath sounds: Normal breath sounds.  Abdominal:     Palpations: Abdomen is soft.     Tenderness: There is abdominal tenderness in the epigastric area.  Musculoskeletal:        General: No swelling.     Cervical back: Normal  range of motion and neck supple.  Skin:    General: Skin is warm and dry.     Capillary Refill: Capillary refill takes less than 2 seconds.  Neurological:     Mental Status: She is alert and oriented to person, place, and time.     Comments: 5+ out of 5 strength throughout, normal sensation, normal visual fields, no drift, normal speech  Psychiatric:        Mood and Affect: Mood normal.     ED Results / Procedures / Treatments   Labs (all labs ordered are listed, but only abnormal results are displayed) Labs Reviewed  COMPREHENSIVE METABOLIC PANEL - Abnormal; Notable for the following components:      Result Value   Glucose, Bld 103 (*)    All other components within normal limits  URINALYSIS, ROUTINE W REFLEX MICROSCOPIC - Abnormal; Notable for the following components:   Color, Urine STRAW (*)     Specific Gravity, Urine 1.003 (*)    Hgb urine dipstick SMALL (*)    All other components within normal limits  CBC  LIPASE, BLOOD    EKG EKG Interpretation Date/Time:  Friday April 20 2023 17:50:29 EDT Ventricular Rate:  68 PR Interval:  167 QRS Duration:  91 QT Interval:  400 QTC Calculation: 426 R Axis:   55  Text Interpretation: Sinus rhythm Confirmed by Virgina Norfolk [phone]) on 04/20/2023 5:58:43 PM  Radiology CT ABDOMEN PELVIS W CONTRAST  Result Date: 04/20/2023 CLINICAL DATA:  Progressive headaches. EXAM: CT ABDOMEN AND PELVIS WITH CONTRAST TECHNIQUE: Multidetector CT imaging of the abdomen and pelvis was performed using the standard protocol following bolus administration of intravenous contrast. RADIATION DOSE REDUCTION: This exam was performed according to the departmental dose-optimization program which includes automated exposure control, adjustment of the mA and/or kV according to patient size and/or use of iterative reconstruction technique. CONTRAST:  OMNIPAQUE IOHEXOL 300 MG/ML  SOLN COMPARISON:  None Available. FINDINGS: Lower chest: Lung bases are clear. Hepatobiliary: No focal hepatic lesion. Normal gallbladder. No biliary duct dilatation. Common bile duct is normal. Pancreas: Pancreas is normal. No ductal dilatation. No pancreatic inflammation. Spleen: Normal spleen Adrenals/urinary tract: RIGHT adrenal gland is rounded enlarged to 15 mm. No change in size from CT 11/04/2021. Kidneys, ureters and bladder normal. Stomach/Bowel: Stomach, small bowel, appendix, and cecum are normal. The colon and rectosigmoid colon are normal. Vascular/Lymphatic: Abdominal aorta is normal caliber with atherosclerotic calcification. There is no retroperitoneal or periportal lymphadenopathy. No pelvic lymphadenopathy. Reproductive: Uterus and adnexa unremarkable. Other: No free fluid. Musculoskeletal: No aggressive osseous lesion. IMPRESSION: 1. No acute findings in the abdomen pelvis. 2.  Normal gallbladder and biliary tree. 3. Normal appendix. 4. Stable RIGHT adrenal nodule over greater than 1 year most consistent benign adenoma. No follow-up recommended 5.  Aortic Atherosclerosis (ICD10-I70.0). Electronically Signed   By: Genevive Bi M.D.   On: 04/20/2023 19:45   CT Head Wo Contrast  Result Date: 04/20/2023 CLINICAL DATA:  Headache, increasing frequency or severity EXAM: CT HEAD WITHOUT CONTRAST TECHNIQUE: Contiguous axial images were obtained from the base of the skull through the vertex without intravenous contrast. RADIATION DOSE REDUCTION: This exam was performed according to the departmental dose-optimization program which includes automated exposure control, adjustment of the mA and/or kV according to patient size and/or use of iterative reconstruction technique. COMPARISON:  Head CT 11/04/2021 FINDINGS: Brain: No intracranial hemorrhage, mass effect, or midline shift. No hydrocephalus. The basilar cisterns are patent. No evidence of territorial infarct or  acute ischemia. No extra-axial or intracranial fluid collection. Vascular: No hyperdense vessel or unexpected calcification. Skull: No fracture or focal lesion. Sinuses/Orbits: Paranasal sinuses and mastoid air cells are clear. The visualized orbits are unremarkable. Other: None. IMPRESSION: Negative noncontrast head CT. Electronically Signed   By: Narda Rutherford M.D.   On: 04/20/2023 19:28    Procedures Procedures    Medications Ordered in ED Medications  sodium chloride 0.9 % bolus 1,000 mL (0 mLs Intravenous Stopped 04/20/23 2032)  prochlorperazine (COMPAZINE) injection 10 mg (10 mg Intravenous Given 04/20/23 1754)  diphenhydrAMINE (BENADRYL) injection 12.5 mg (12.5 mg Intravenous Given 04/20/23 1753)  iohexol (OMNIPAQUE) 300 MG/ML solution 100 mL (100 mLs Intravenous Contrast Given 04/20/23 1851)    ED Course/ Medical Decision Making/ A&P                                 Medical Decision Making Amount and/or  Complexity of Data Reviewed Labs: ordered. Radiology: ordered.  Risk Prescription drug management.   Hamdi A Kalliopi Feliciano is here with upper abdominal pain, headaches.  History of acid reflux, hypertension.  Normal vitals.  No fever.  Very well-appearing.  Neurologically intact.  Differential diagnosis likely headache is migraine related seems like there is been no trauma.  Have no concern for infectious process.  No concern for stroke.  However she been having more frequent headaches and will get a head CT to further evaluate.  She is having some ongoing upper abdominal pain.  Takes medicine for acid reflux.  Differential diagnosis likely ongoing gastritis but could be gallbladder or pancreas related and will evaluate for pancreatitis, cholecystitis, bowel obstruction with a CT scan abdomen pelvis.  Will get CBC, CMP, lipase.  Does not have any chest pain or shortness of breath and have no concern for cardiac process but will get EKG.  Will give Compazine, Benadryl and IV fluids and reevaluate.  Overall lab work per my review and interpretation shows no significant anemia, electrolyte abnormality, kidney injury or leukocytosis.  Overall CT scan the head and abdomen pelvis per radiology report unremarkable.  On reevaluation she is feeling much better.  I suspect migraine headache.  I suspect stomach inflammation/gastritis.  Will restart her on Nexium.  Will give her Zofran to use as needed.  Will have her follow-up with her primary care doctor.  She understands this plan.  Discharged in good condition.  She understands return precautions.  This chart was dictated using voice recognition software.  Despite best efforts to proofread,  errors can occur which can change the documentation meaning.         Final Clinical Impression(s) / ED Diagnoses Final diagnoses:  Other migraine without status migrainosus, not intractable  Epigastric pain    Rx / DC Orders ED Discharge Orders           Ordered    esomeprazole (NEXIUM) 40 MG capsule  Daily        04/20/23 2011    ondansetron (ZOFRAN-ODT) 4 MG disintegrating tablet  Every 8 hours PRN        04/20/23 2011              Virgina Norfolk, DO 04/20/23 2101

## 2023-04-27 ENCOUNTER — Other Ambulatory Visit: Payer: Self-pay | Admitting: Obstetrics and Gynecology

## 2023-04-27 ENCOUNTER — Inpatient Hospital Stay: Payer: Self-pay | Attending: Hematology and Oncology | Admitting: Hematology and Oncology

## 2023-04-27 VITALS — BP 140/64 | HR 73 | Temp 97.9°F | Resp 17 | Wt 242.4 lb

## 2023-04-27 DIAGNOSIS — N6092 Unspecified benign mammary dysplasia of left breast: Secondary | ICD-10-CM | POA: Insufficient documentation

## 2023-04-27 DIAGNOSIS — N63 Unspecified lump in unspecified breast: Secondary | ICD-10-CM

## 2023-04-27 DIAGNOSIS — N6099 Unspecified benign mammary dysplasia of unspecified breast: Secondary | ICD-10-CM

## 2023-04-27 DIAGNOSIS — Z79811 Long term (current) use of aromatase inhibitors: Secondary | ICD-10-CM | POA: Insufficient documentation

## 2023-04-27 NOTE — Assessment & Plan Note (Signed)
This is a 52 year old female patient with newly diagnosed atypical lobular hyperplasia referred to breast oncology for recommendations.  We have discussed the following details about atypical lobular hyperplasia and role of tamoxifen.  She is now status post lumpectomy and final pathology showed atypical lobular hyperplasia.  We once again discussed about antiestrogen therapy with tamoxifen.  We have discussed about mechanism of action, adverse effects of tamoxifen including but not limited to postmenopausal symptoms, increased risk of DVT/PE, endometrial hyperplasia and endometrial carcinoma.  Benefit of tamoxifen would be improvement in bone density.  She may also be a candidate for aromatase inhibitors if she is postmenopausal.

## 2023-04-27 NOTE — Progress Notes (Signed)
[facility] CONSULT NOTE  Patient Care Team: Patient, [name] as PCP - General (General Practice)  CHIEF COMPLAINTS/PURPOSE OF CONSULTATION:  ALH  ASSESSMENT AND PLAN:   This is a 52 yr old pleasant female patient post menopausal ( didn't have menstrual cycle in 4 yrs 0 referred after findings of ALH for consideration of anti estrogen therapy. She is here for follow-up on anastrozole, unfortunately she has not tolerated this well.  She apparently felt nauseated, fevers, bone pains and did not feel well at all.  Hence she stopped it and she felt better. On physical examination, bilateral breast without any palpable masses or regional adenopathy.  Surgical scar noted.  We have discussed about role of MRIs in addition to mammograms given [facility] and since she is not able to take antiestrogen therapy however given her lack of insurance she wants to continue with mammograms alone.  This will be due again in October and this has been ordered.  I have also given her the phone number to breast center to call and schedule her mammogram as well as her baseline bone density. She wants to continue follow-up with [place] annually, she will return to clinic next year.  HISTORY OF PRESENTING ILLNESS:  Jill Conrad 52 y.o. female is here because of recent diagnosis of left breast calcifications.  Patient has screening mammogram and was recalled from screening.  Diagnostic mammogram showed indeterminate left breast calcification and probably benign bilateral breast masses.  Ultrasound confirmed the above-mentioned findings. She had left breast needle core biopsy upper outer region which showed focal atypical lobular hyperplasia and fibrocystic changes with calcifications. She had certified spanish interpretor present for the entirety of conversation.    Given her menopausal status, we discussed about tamoxifen vs aromatase inhibitors for breast cancer prevention.  She is here with a  certified Spanish interpreter for follow-up.  Apparently she felt very poorly on anastrozole, she started having bone pains, fevers, nausea and could not continue the medication, stopped taking it.  She does not want to take any other medication at this time.  She would like to proceed with surveillance alone.  Rest of the pertinent 10 point ROS reviewed and negative  MEDICAL HISTORY:  Past Medical History:  Diagnosis Date   GERD (gastroesophageal reflux disease)    High cholesterol    Hypertension     SURGICAL HISTORY: Past Surgical History:  Procedure Laterality Date   BREAST BIOPSY Left 06/23/2022   MM LT BREAST BX W LOC DEV 1ST LESION IMAGE BX SPEC STEREO GUIDE 06/23/2022 [facility]   BREAST BIOPSY  10/05/2022   MM LT RADIOACTIVE SEED LOC MAMMO GUIDE 10/05/2022 [facility]   BREAST LUMPECTOMY WITH RADIOACTIVE SEED LOCALIZATION Left 10/06/2022   Procedure: LEFT BREAST LUMPECTOMY WITH RADIOACTIVE SEED LOCALIZATION;  Surgeon: Griselda Miner, MD;  Location: [facility];  Service: General;  Laterality: Left;    SOCIAL HISTORY: Social History   Socioeconomic History   Marital status: Married    Spouse name: Not on file   Number of children: 1   Years of education: Not on file   Highest education level: 5th grade  Occupational History   Not on file  Tobacco Use   Smoking status: Never   Smokeless tobacco: Never  Vaping Use   Vaping status: Never Used  Substance and Sexual Activity   Alcohol use: No   Drug use: No   Sexual activity: Yes    Birth control/protection: Post-menopausal  Other Topics Concern   Not on file  Social History Narrative   Not on file   Social Determinants of Health   Financial Resource Strain: Not on file  Food Insecurity: No Food Insecurity (02/16/2022)   Hunger Vital Sign    Worried About Running Out of Food in the Last Year: Never true    Ran Out of Food in the Last Year: Never true  Transportation Needs: No  Transportation Needs (02/16/2022)   PRAPARE - Administrator, Civil Service (Medical): No    Lack of Transportation (Non-Medical): No  Physical Activity: Not on file  Stress: Not on file  Social Connections: Unknown (12/30/2021)   Received from [facility]   Social Network    Social Network: Not on file  Intimate Partner Violence: Unknown (11/21/2021)   Received from [facility], [facility]   HITS    Physically Hurt: Not on file    Insult or Talk Down To: Not on file    Threaten Physical Harm: Not on file    Scream or Curse: Not on file    FAMILY HISTORY: Family History  Problem Relation Age of Onset   Breast cancer Neg Hx     ALLERGIES:  has No Known Allergies.  MEDICATIONS:  Current Outpatient Medications  Medication Sig Dispense Refill   anastrozole (ARIMIDEX) 1 MG tablet Take 1 tablet (1 mg total) by mouth daily. 90 tablet 3   atorvastatin (LIPITOR) 20 MG tablet Take 20 mg by mouth daily.     esomeprazole (NEXIUM) 40 MG capsule Take 1 capsule (40 mg total) by mouth daily for 14 doses. 14 capsule 0   lisinopril (ZESTRIL) 20 MG tablet Take 20 mg by mouth daily.     ondansetron (ZOFRAN) 4 MG tablet Take 4 mg by mouth 2 (two) times daily as needed for nausea or vomiting.     ondansetron (ZOFRAN-ODT) 4 MG disintegrating tablet Take 1 tablet (4 mg total) by mouth every 8 (eight) hours as needed for nausea or vomiting. 20 tablet 0   oxyCODONE (ROXICODONE) 5 MG immediate release tablet Take 1 tablet (5 mg total) by mouth every 6 (six) hours as needed for severe pain. 10 tablet 0   pantoprazole (PROTONIX) 20 MG tablet Take 20 mg by mouth daily.     No current facility-administered medications for this visit.    REVIEW OF SYSTEMS:   Constitutional: Denies fevers, chills or abnormal night sweats Eyes: Denies blurriness of vision, double vision or watery eyes Ears, nose, mouth, throat, and face: Denies mucositis or sore throat Respiratory: Denies  cough, dyspnea or wheezes Cardiovascular: Denies palpitation, chest discomfort or lower extremity swelling Gastrointestinal:  Denies nausea, heartburn or change in bowel habits Skin: Denies abnormal skin rashes Lymphatics: Denies new lymphadenopathy or easy bruising Neurological:Denies numbness, tingling or new weaknesses Behavioral/Psych: Mood is stable, no new changes  Breast: Denies any palpable lumps or discharge All other systems were reviewed with the patient and are negative.  PHYSICAL EXAMINATION: ECOG PERFORMANCE STATUS: 0 - Asymptomatic  Vitals:   04/27/23 1347  BP: (!) 140/64  Pulse: 73  Resp: 17  Temp: 97.9 F (36.6 C)  SpO2: 99%    Filed Weights   04/27/23 1347  Weight: 242 lb 6.4 oz (110 kg)   General appearance: Alert, oriented and in no acute distress Chest: Bilateral breast inspected and palpated.  No palpable masses or regional adenopathy  LABORATORY DATA:  I have reviewed the data as listed  Lab Results  Component Value Date   WBC 9.3 04/20/2023   HGB 13.0 04/20/2023   HCT 40.3 04/20/2023   MCV 85.7 04/20/2023   PLT 325 04/20/2023   Lab Results  Component Value Date   NA 138 04/20/2023   K 3.6 04/20/2023   CL 102 04/20/2023   CO2 28 04/20/2023    RADIOGRAPHIC STUDIES: I have personally reviewed the radiological reports and agreed with the findings in the report.  Total time spent: 20 minutes including history, physical exam, review of records, counseling and coordination of care All questions were answered. The patient knows to call the clinic with any problems, questions or concerns.    Rachel Moulds, MD 04/27/23

## 2023-05-24 ENCOUNTER — Ambulatory Visit: Payer: Self-pay | Admitting: Hematology and Oncology

## 2023-05-24 VITALS — BP 130/66 | Wt 242.0 lb

## 2023-05-24 DIAGNOSIS — Z1211 Encounter for screening for malignant neoplasm of colon: Secondary | ICD-10-CM

## 2023-05-24 DIAGNOSIS — N6320 Unspecified lump in the left breast, unspecified quadrant: Secondary | ICD-10-CM

## 2023-05-24 NOTE — Progress Notes (Signed)
[name] is a 52 y.o. female who presents to [facility] clinic today with no complaints.    Pap Smear: Pap not smear completed today. Last Pap smear was 02/16/2022 and was normal. Per patient has no history of an abnormal Pap smear. Last Pap smear result is available in Epic.   Physical exam: Breasts Breasts symmetrical. No skin abnormalities bilateral breasts. No nipple retraction bilateral breasts. No nipple discharge bilateral breasts. No lymphadenopathy. No lumps palpated bilateral breasts. MM Breast Surgical Specimen  Result Date: 10/06/2022 CLINICAL DATA:  Biopsy-proven atypical lobular hyperplasia involving the UPPER OUTER QUADRANT of the LEFT breast. Radioactive seed localization was performed yesterday in anticipation of today's excisional biopsy. EXAM: SPECIMEN RADIOGRAPH OF THE LEFT BREAST COMPARISON:  Previous exam(s). FINDINGS: Status post excision of the LEFT breast. The radioactive seed in the coil shaped tissue marking clip are present within the non-compressed specimen. The seed is intact. This was discussed by telephone with the operating room nurse at the time of interpretation on 10/06/2022 at 8:52 a.m. IMPRESSION: Specimen radiograph of the LEFT breast. Electronically Signed   By: Hulan Saas M.D.   On: 10/06/2022 08:54  MM LT RADIOACTIVE SEED LOC MAMMO GUIDE  Result Date: 10/05/2022 CLINICAL DATA:  52 year old female presenting for radioactive seed localization of the left breast prior to excisional biopsy. EXAM: MAMMOGRAPHIC GUIDED RADIOACTIVE SEED LOCALIZATION OF THE LEFT BREAST COMPARISON:  Previous exam(s). FINDINGS: Patient presents for radioactive seed localization prior to excisional biopsy of the left breast. I met with the patient and we discussed the procedure of seed localization including benefits and alternatives. We discussed the high likelihood of a successful procedure. We discussed the risks of the procedure including infection, bleeding, tissue  injury and further surgery. We discussed the low dose of radioactivity involved in the procedure. Informed, written consent was given. The usual time-out protocol was performed immediately prior to the procedure. Using mammographic guidance, sterile technique, 1% lidocaine and an I-125 radioactive seed, the coil shaped biopsy marking clip in the superior left breast was localized using a superior approach. The follow-up mammogram images confirm the seed in the expected location and were marked for Dr. Carolynne Edouard. Follow-up survey of the patient confirms presence of the radioactive seed. Order number of I-125 seed:  [id number]. Total activity:  0.236 millicuries reference Date: 09/08/2022 The patient tolerated the procedure well and was released from the Breast Center. She was given instructions regarding seed removal. IMPRESSION: Radioactive seed localization left breast. No apparent complications. Electronically Signed   By: Frederico Hamman M.D.   On: 10/05/2022 13:27  MM LT BREAST BX W LOC DEV 1ST LESION IMAGE BX SPEC STEREO GUIDE  Addendum Date: 07/02/2022   ADDENDUM REPORT: 07/02/2022 12:48 ADDENDUM: Pathology revealed FOCAL ATYPICAL LOBULAR HYPERPLASIA (ALH), FIBROCYSTIC CHANGES WITH CALCIFICATIONS of the LEFT breast, upper outer, (coil clip). This was found to be concordant by Dr. Gerome Sam with surgical consultation and High Risk Screening recommended. Pathology results were discussed with the patient by telephone by [facility] Representative # [phone]. The patient reported doing well after the biopsy with tenderness at the site. Post biopsy instructions and care were reviewed and questions were answered. The patient was encouraged to call The [facility] for any additional concerns. Follow up protocol for patients with lobular neoplasia being observed will have diagnostic mammograms for two years (6 month follow up, 6 month follow up, 12 month follow up), then returned  to annual screening mammograms. The patient was  asked to return for BILATERAL diagnostic mammography and ultrasound in 6 months and informed a reminder notice would be sent regarding this appointment. Surgical consultation has been arranged with Dr. Chevis Pretty at [facility] on August 11, 2022. Pathology results reported by Rene Kocher, RN on 06/27/2022. Electronically Signed   By: Gerome Sam III M.D.   On: 07/02/2022 12:48   Result Date: 07/02/2022 CLINICAL DATA:  Biopsy left breast calcifications EXAM: LEFT BREAST STEREOTACTIC CORE NEEDLE BIOPSY COMPARISON:  Previous exam(s). FINDINGS: The patient and I discussed the procedure of stereotactic-guided biopsy including benefits and alternatives. We discussed the high likelihood of a successful procedure. We discussed the risks of the procedure including infection, bleeding, tissue injury, clip migration, and inadequate sampling. Informed written consent was given. The usual time out protocol was performed immediately prior to the procedure. Using sterile technique and 1% Lidocaine as local anesthetic, under stereotactic guidance, a 9 gauge vacuum assisted device was used to perform core needle biopsy of calcifications in the upper-outer left breast using a superior approach. Specimen radiograph was performed showing calcifications in 2/3 specimen. Specimens with calcifications are identified for pathology. Lesion quadrant: Upper outer left breast At the conclusion of the procedure, a coil shaped tissue marker clip was deployed into the biopsy cavity. Follow-up 2-view mammogram was performed and dictated separately. IMPRESSION: Stereotactic-guided biopsy of left breast calcifications. No apparent complications. Electronically Signed: By: Gerome Sam III M.D. On: 06/23/2022 14:29  MM CLIP PLACEMENT LEFT  Result Date: 06/23/2022 CLINICAL DATA:  Evaluate biopsy marker EXAM: 3D DIAGNOSTIC LEFT MAMMOGRAM POST STEREOTACTIC BIOPSY COMPARISON:   Previous exam(s). FINDINGS: 3D Mammographic images were obtained following stereotactic guided biopsy of left breast calcifications. The biopsy marking clip is in expected position at the site of biopsy. IMPRESSION: Appropriate positioning of the coil shaped biopsy marking clip at the site of biopsy in the location of the biopsied left breast calcifications. Final Assessment: Post Procedure Mammograms for Marker Placement Electronically Signed   By: Gerome Sam III M.D.   On: 06/23/2022 14:30  MM DIAG BREAST TOMO BILATERAL  Result Date: 06/02/2022 CLINICAL DATA:  Patient recalled from screening for bilateral masses and left breast calcifications. EXAM: DIGITAL DIAGNOSTIC BILATERAL MAMMOGRAM WITH TOMOSYNTHESIS; ULTRASOUND RIGHT BREAST LIMITED; ULTRASOUND LEFT BREAST LIMITED TECHNIQUE: Bilateral digital diagnostic mammography and breast tomosynthesis was performed.; Targeted ultrasound examination of the right breast was performed; Targeted ultrasound examination of the left breast was performed. COMPARISON:  Previous exam(s). ACR Breast Density Category c: The breast tissue is heterogeneously dense, which may obscure small masses. FINDINGS: Persistent oval mass within the central slightly superior right breast. Within the upper-outer left breast middle depth there is a persistent oval mass. Within the upper-outer left breast posterior depth there is a persistent oval mass. Within the superior slightly lateral left breast middle depth there is an indeterminate 1.6 cm group of calcifications, further evaluated with magnification views. Targeted ultrasound is performed, showing a 10 x 9 x 7 mm oval hypoechoic mass left breast 2 o'clock position 4 cm from nipple. There is an 8 x 8 x 5 mm oval hypoechoic mass left breast 2 o'clock position 8 cm from nipple. No left axillary adenopathy. Dilated duct with internal debris right breast 10 o'clock position 3 cm from nipple. Mildly complicated cyst right breast 12  o'clock position 3 cm from nipple measuring 9 x 7 x 5 mm. Oval hypoechoic 10 x 7 x 4 mm mass right breast 12 o'clock position 6 cm from nipple.  No right axillary adenopathy. IMPRESSION: 1. Indeterminate left breast calcifications. 2. Probably benign bilateral breast masses. RECOMMENDATION: 1. Stereotactic guided core needle biopsy left breast calcifications. 2. If this demonstrates benign pathology, recommend six-month follow-up bilateral diagnostic mammography and bilateral breast ultrasound to reassess the probably benign bilateral breast masses. I have discussed the findings and recommendations with the patient. If applicable, a reminder letter will be sent to the patient regarding the next appointment. BI-RADS CATEGORY  4: Suspicious. Electronically Signed   By: Annia Belt M.D.   On: 06/02/2022 14:37  MS DIGITAL SCREENING TOMO BILATERAL  Result Date: 02/17/2022 CLINICAL DATA:  Screening. EXAM: DIGITAL SCREENING BILATERAL MAMMOGRAM WITH TOMOSYNTHESIS AND CAD TECHNIQUE: Bilateral screening digital craniocaudal and mediolateral oblique mammograms were obtained. Bilateral screening digital breast tomosynthesis was performed. The images were evaluated with computer-aided detection. COMPARISON:  Previous exam(s). ACR Breast Density Category c: The breast tissue is heterogeneously dense, which may obscure small masses. FINDINGS: In the right breast a mass requires further evaluation. In the left breast 2 masses and a group of calcifications require further evaluation. IMPRESSION: Further evaluation is suggested for possible mass in the right breast. Further evaluation is suggested for possible masses in calcifications in the left breast. RECOMMENDATION: Diagnostic mammogram and possibly ultrasound of both breasts. (Code:[id number]) The patient will be contacted regarding the findings, and additional imaging will be scheduled. BI-RADS CATEGORY  0: Incomplete. Need additional imaging evaluation and/or prior mammograms  for comparison. Electronically Signed   By: Gerome Sam III M.D.   On: 02/17/2022 18:12          Pelvic/Bimanual Pap is not indicated today    Smoking History: Patient has never smoked and was not referred to quit line.    Patient Navigation: Patient education provided. Access to services provided for patient through BCCCP program. Natale Lay interpreter provided. No transportation provided   Colorectal Cancer Screening: Per patient has had colonoscopy completed on 2023 with benign results. Will follow up in 10 years.  No complaints today.    Breast and Cervical Cancer Risk Assessment: Patient has family history of breast cancer, with her maternal aunt x 2. Patient does not have history of cervical dysplasia, immunocompromised, or DES exposure in-utero.  Risk Scores as of Encounter on 05/24/2023     Jill Conrad           5-year 2.09%   Lifetime 17.38%   This patient is [place]/Lat[place] but has no documented birth country, so the [place] model used data from [place] patients to calculate their risk score. Document a birth country in the Demographics activity for a more accurate score.         Last calculated by Caprice Red, CMA on 05/24/2023 at 10:56 AM          A: BCCCP exam without pap smear No complaints with benign exam.   P: Referred patient to the [facility] for a diagnostic mammogram. Appointment scheduled 05/25/2023.  Ilda Basset A, NP 05/24/2023 10:55 AM

## 2023-05-24 NOTE — Patient Instructions (Signed)
Taught Jill Conrad about self breast awareness and gave educational materials to take home. Patient did not need a Pap smear today due to last Pap smear was in 02/16/22 per patient. Let her know BCCCP will cover Pap smears every 5 years unless has a history of abnormal Pap smears. Referred patient to the [facility] for diagnostic mammogram. Appointment scheduled for . Patient aware of appointment and will be there. Let patient know will follow up with her within the next couple weeks with results. Jill Conrad verbalized understanding.  Pascal Lux, NP 10:56 AM

## 2023-05-25 ENCOUNTER — Ambulatory Visit
Admission: RE | Admit: 2023-05-25 | Discharge: 2023-05-25 | Disposition: A | Discharge status: Home / Self Care | Payer: No Typology Code available for payment source | Source: Ambulatory Visit | Attending: Obstetrics and Gynecology | Admitting: Obstetrics and Gynecology

## 2023-05-25 ENCOUNTER — Ambulatory Visit
Admission: RE | Admit: 2023-05-25 | Discharge: 2023-05-25 | Disposition: A | Discharge status: Home / Self Care | Payer: Self-pay | Source: Ambulatory Visit | Attending: Obstetrics and Gynecology | Admitting: Obstetrics and Gynecology

## 2023-05-25 DIAGNOSIS — N63 Unspecified lump in unspecified breast: Secondary | ICD-10-CM

## 2024-04-14 ENCOUNTER — Telehealth: Payer: Self-pay | Admitting: Hematology and Oncology

## 2024-04-14 ENCOUNTER — Other Ambulatory Visit: Payer: Self-pay

## 2024-04-14 DIAGNOSIS — N6310 Unspecified lump in the right breast, unspecified quadrant: Secondary | ICD-10-CM

## 2024-04-14 DIAGNOSIS — N6099 Unspecified benign mammary dysplasia of unspecified breast: Secondary | ICD-10-CM

## 2024-04-14 NOTE — Telephone Encounter (Signed)
 [id number] interpretor ID. Jill Conrad has been re-scheduled and made aware of her re-scheduled appointment.

## 2024-05-02 ENCOUNTER — Ambulatory Visit: Payer: Self-pay | Admitting: Hematology and Oncology

## 2024-05-06 ENCOUNTER — Inpatient Hospital Stay: Payer: Self-pay | Attending: Hematology and Oncology | Admitting: Hematology and Oncology

## 2024-05-06 VITALS — BP 111/49 | HR 69 | Temp 97.9°F | Resp 22 | Wt 252.5 lb

## 2024-05-06 DIAGNOSIS — B369 Superficial mycosis, unspecified: Secondary | ICD-10-CM | POA: Insufficient documentation

## 2024-05-06 DIAGNOSIS — N6099 Unspecified benign mammary dysplasia of unspecified breast: Secondary | ICD-10-CM

## 2024-05-06 DIAGNOSIS — L304 Erythema intertrigo: Secondary | ICD-10-CM | POA: Insufficient documentation

## 2024-05-06 DIAGNOSIS — N6092 Unspecified benign mammary dysplasia of left breast: Secondary | ICD-10-CM | POA: Insufficient documentation

## 2024-05-06 MED ORDER — NYSTATIN 100000 UNIT/GM EX POWD: 1.0000 | Freq: Three times a day (TID) | CUTANEOUS | 0 refills | Status: AC

## 2024-05-06 NOTE — Progress Notes (Signed)
 [facility] CONSULT NOTE  Patient Care Team: Patient, [name] as PCP - General (General Practice)  CHIEF COMPLAINTS/PURPOSE OF CONSULTATION:  ALH  ASSESSMENT AND PLAN:   This is a 53 yr old pleasant female patient post menopausal ( didn't have menstrual cycle in 4 yrs 0 referred after findings of ALH for consideration of anti estrogen therapy. She is here for follow-up on anastrozole , unfortunately she has not tolerated this well  Assessment and Plan Assessment & Plan Atypical lobular hyperplasia of left breast, couldn't tolerate anti estrogen therapy. Atypical lobular hyperplasia, couldn't tolerate anti estrogen therapy. - Continue monitoring with mammogram and ultrasound on June 05, 2024, at 10:40 AM at the [facility]. - No concerns on physical exam today - She will continue annual follow up.  Intertrigo under breast Fungal rash under the breast, likely intertrigo. - Prescribe antifungal powder for application under the breast.   HISTORY OF PRESENTING ILLNESS:  Jill Conrad 53 y.o. female is here because of recent diagnosis of left breast calcifications.  Patient has screening mammogram and was recalled from screening.  Diagnostic mammogram showed indeterminate left breast calcification and probably benign bilateral breast masses.  Ultrasound confirmed the above-mentioned findings. She had left breast needle core biopsy upper outer region which showed focal atypical lobular hyperplasia and fibrocystic changes with calcifications. She had certified spanish interpretor present for the entirety of conversation.    Given her menopausal status, we discussed about tamoxifen vs aromatase inhibitors for breast cancer prevention.  She is here with a certified Spanish interpreter for follow-up.  Apparently she felt very poorly on anastrozole , she started having bone pains, fevers, nausea and could not continue the medication, stopped  taking it.  She does not want to take any other medication at this time.  She would like to proceed with surveillance alone. Since her last visit, she denies any new complaints. She hasn't been very active, walks occasionally.  Rest of the pertinent 10 point ROS reviewed and negative  MEDICAL HISTORY:  Past Medical History:  Diagnosis Date   GERD (gastroesophageal reflux disease)    High cholesterol    Hypertension     SURGICAL HISTORY: Past Surgical History:  Procedure Laterality Date   BREAST BIOPSY Left 06/23/2022   MM LT BREAST BX W LOC DEV 1ST LESION IMAGE BX SPEC STEREO GUIDE 06/23/2022 [facility]   BREAST BIOPSY  10/05/2022   MM LT RADIOACTIVE SEED LOC MAMMO GUIDE 10/05/2022 [facility]   BREAST LUMPECTOMY WITH RADIOACTIVE SEED LOCALIZATION Left 10/06/2022   Procedure: LEFT BREAST LUMPECTOMY WITH RADIOACTIVE SEED LOCALIZATION;  Surgeon: Curvin Deward MOULD, MD;  Location: [facility];  Service: General;  Laterality: Left;    SOCIAL HISTORY: Social History   Socioeconomic History   Marital status: Married    Spouse name: Not on file   Number of children: 1   Years of education: Not on file   Highest education level: 5th grade  Occupational History   Not on file  Tobacco Use   Smoking status: Never   Smokeless tobacco: Never  Vaping Use   Vaping status: Never Used  Substance and Sexual Activity   Alcohol use: No   Drug use: No   Sexual activity: Yes    Birth control/protection: Post-menopausal  Other Topics Concern   Not on file  Social History Narrative   Not on file   Social Drivers of Health   Financial Resource Strain: Not on file  Food Insecurity: No Food Insecurity (05/24/2023)   Hunger Vital Sign    Worried About Running Out of Food in the Last Year: Never true    Ran Out of Food in the Last Year: Never true  Transportation Needs: No Transportation Needs (05/24/2023)   PRAPARE - Administrator, Civil Service (Medical):  No    Lack of Transportation (Non-Medical): No  Physical Activity: Not on file  Stress: Not on file  Social Connections: Unknown (12/30/2021)   [facility]   Social Network    Social Network: Not on file  Intimate Partner Violence: Unknown (11/21/2021)   Received from [facility]   HITS    Physically Hurt: Not on file    Insult or Talk Down To: Not on file    Threaten Physical Harm: Not on file    Scream or Curse: Not on file    FAMILY HISTORY: Family History  Problem Relation Age of Onset   Breast cancer Neg Hx     ALLERGIES:  has no known allergies.  MEDICATIONS:  Current Outpatient Medications  Medication Sig Dispense Refill   lisinopril-hydrochlorothiazide (ZESTORETIC) 10-12.5 MG tablet Take 1 tablet by mouth daily.     anastrozole  (ARIMIDEX ) 1 MG tablet Take 1 tablet (1 mg total) by mouth daily. (Patient not taking: Reported on 05/06/2024) 90 tablet 3   atorvastatin (LIPITOR) 20 MG tablet Take 20 mg by mouth daily. (Patient not taking: Reported on 05/06/2024)     esomeprazole  (NEXIUM ) 40 MG capsule Take 1 capsule (40 mg total) by mouth daily for 14 doses. (Patient not taking: Reported on 05/06/2024) 14 capsule 0   ondansetron  (ZOFRAN ) 4 MG tablet Take 4 mg by mouth 2 (two) times daily as needed for nausea or vomiting. (Patient not taking: Reported on 05/24/2023)     ondansetron  (ZOFRAN -ODT) 4 MG disintegrating tablet Take 1 tablet (4 mg total) by mouth every 8 (eight) hours as needed for nausea or vomiting. (Patient not taking: Reported on 05/24/2023) 20 tablet 0   pantoprazole (PROTONIX) 20 MG tablet Take 20 mg by mouth daily. (Patient not taking: Reported on 05/06/2024)     No current facility-administered medications for this visit.    REVIEW OF SYSTEMS:   Constitutional: Denies fevers, chills or abnormal night sweats Eyes: Denies blurriness of vision, double vision or watery eyes Ears, nose, mouth, throat, and face: Denies mucositis or sore  throat Respiratory: Denies cough, dyspnea or wheezes Cardiovascular: Denies palpitation, chest discomfort or lower extremity swelling Gastrointestinal:  Denies nausea, heartburn or change in bowel habits Skin: Denies abnormal skin rashes Lymphatics: Denies new lymphadenopathy or easy bruising Neurological:Denies numbness, tingling or new weaknesses Behavioral/Psych: Mood is stable, no new changes  Breast: Denies any palpable lumps or discharge All other systems were reviewed with the patient and are negative.  PHYSICAL EXAMINATION: ECOG PERFORMANCE STATUS: 0 - Asymptomatic  Vitals:   05/06/24 1408  BP: (!) 111/49  Pulse: 69  Resp: (!) 22  Temp: 97.9 F (36.6 C)  SpO2: 99%    Filed Weights   05/06/24 1408  Weight: 252 lb 8 oz (114.5 kg)   General appearance: Alert, oriented and in no acute distress Chest: Bilateral breast inspected and palpated.  No palpable masses or regional adenopathy  LABORATORY DATA:  I have reviewed the data as listed Lab Results  Component Value Date   WBC 9.3 04/20/2023   HGB 13.0 04/20/2023   HCT 40.3 04/20/2023   MCV 85.7 04/20/2023  PLT 325 04/20/2023   Lab Results  Component Value Date   NA 138 04/20/2023   K 3.6 04/20/2023   CL 102 04/20/2023   CO2 28 04/20/2023    RADIOGRAPHIC STUDIES: I have personally reviewed the radiological reports and agreed with the findings in the report.  Total time spent: 20 minutes including history, physical exam, review of records, counseling and coordination of care All questions were answered. The patient knows to call the clinic with any problems, questions or concerns.    Amber Stalls, MD 05/06/24

## 2024-05-06 NOTE — Progress Notes (Signed)
 [facility] CONSULT NOTE  Patient Care Team: Patient, [name] as PCP - General (General Practice)  CHIEF COMPLAINTS/PURPOSE OF CONSULTATION:  ALH  ASSESSMENT AND PLAN:   This is a 53 yr old pleasant female patient post menopausal ( didn't have menstrual cycle in 4 yrs 0 referred after findings of ALH for consideration of anti estrogen therapy. She is here for follow-up on anastrozole , unfortunately she has not tolerated this well. She opted observation alone. We discussed mammograms and MRI, but she is uninsured, she didn't want to consider MRI.   HISTORY OF PRESENTING ILLNESS:  Jill Conrad 53 y.o. female is here because of recent diagnosis of ALH left breast  Patient has screening mammogram and was recalled from screening.  Diagnostic mammogram showed indeterminate left breast calcification and probably benign bilateral breast masses.  Ultrasound confirmed the above-mentioned findings. She had left breast needle core biopsy upper outer region which showed focal atypical lobular hyperplasia and fibrocystic changes with calcifications. She had certified spanish interpretor present for the entirety of conversation.   Given her menopausal status, we discussed about tamoxifen vs aromatase inhibitors for breast cancer prevention.  She is here with a certified Spanish interpreter for follow-up.   Rest of the pertinent 10 point ROS reviewed and negative  MEDICAL HISTORY:  Past Medical History:  Diagnosis Date   GERD (gastroesophageal reflux disease)    High cholesterol    Hypertension     SURGICAL HISTORY: Past Surgical History:  Procedure Laterality Date   BREAST BIOPSY Left 06/23/2022   MM LT BREAST BX W LOC DEV 1ST LESION IMAGE BX SPEC STEREO GUIDE 06/23/2022 [facility]   BREAST BIOPSY  10/05/2022   MM LT RADIOACTIVE SEED LOC MAMMO GUIDE 10/05/2022 [facility]   BREAST LUMPECTOMY WITH RADIOACTIVE SEED LOCALIZATION Left 10/06/2022    Procedure: LEFT BREAST LUMPECTOMY WITH RADIOACTIVE SEED LOCALIZATION;  Surgeon: Curvin Deward MOULD, MD;  Location: [facility];  Service: General;  Laterality: Left;    SOCIAL HISTORY: Social History   Socioeconomic History   Marital status: Married    Spouse name: Not on file   Number of children: 1   Years of education: Not on file   Highest education level: 5th grade  Occupational History   Not on file  Tobacco Use   Smoking status: Never   Smokeless tobacco: Never  Vaping Use   Vaping status: Never Used  Substance and Sexual Activity   Alcohol use: No   Drug use: No   Sexual activity: Yes    Birth control/protection: Post-menopausal  Other Topics Concern   Not on file  Social History Narrative   Not on file   Social Drivers of Health   Financial Resource Strain: Not on file  Food Insecurity: No Food Insecurity (05/24/2023)   Hunger Vital Sign    Worried About Running Out of Food in the Last Year: Never true    Ran Out of Food in the Last Year: Never true  Transportation Needs: No Transportation Needs (05/24/2023)   PRAPARE - Administrator, Civil Service (Medical): No    Lack of Transportation (Non-Medical): No  Physical Activity: Not on file  Stress: Not on file  Social Connections: Unknown (12/30/2021)   Received from [facility]   Social Network    Social Network: Not on file  Intimate Partner Violence: Unknown (11/21/2021)   Received from [facility]   HITS    Physically Hurt: Not on  file    Insult or Talk Down To: Not on file    Threaten Physical Harm: Not on file    Scream or Curse: Not on file    FAMILY HISTORY: Family History  Problem Relation Age of Onset   Breast cancer Neg Hx     ALLERGIES:  has no known allergies.  MEDICATIONS:  Current Outpatient Medications  Medication Sig Dispense Refill   lisinopril-hydrochlorothiazide (ZESTORETIC) 10-12.5 MG tablet Take 1 tablet by mouth daily.     anastrozole  (ARIMIDEX ) 1 MG  tablet Take 1 tablet (1 mg total) by mouth daily. (Patient not taking: Reported on 05/06/2024) 90 tablet 3   atorvastatin (LIPITOR) 20 MG tablet Take 20 mg by mouth daily. (Patient not taking: Reported on 05/06/2024)     esomeprazole  (NEXIUM ) 40 MG capsule Take 1 capsule (40 mg total) by mouth daily for 14 doses. (Patient not taking: Reported on 05/06/2024) 14 capsule 0   ondansetron  (ZOFRAN ) 4 MG tablet Take 4 mg by mouth 2 (two) times daily as needed for nausea or vomiting. (Patient not taking: Reported on 05/24/2023)     ondansetron  (ZOFRAN -ODT) 4 MG disintegrating tablet Take 1 tablet (4 mg total) by mouth every 8 (eight) hours as needed for nausea or vomiting. (Patient not taking: Reported on 05/24/2023) 20 tablet 0   pantoprazole (PROTONIX) 20 MG tablet Take 20 mg by mouth daily. (Patient not taking: Reported on 05/06/2024)     No current facility-administered medications for this visit.    REVIEW OF SYSTEMS:   Constitutional: Denies fevers, chills or abnormal night sweats Eyes: Denies blurriness of vision, double vision or watery eyes Ears, nose, mouth, throat, and face: Denies mucositis or sore throat Respiratory: Denies cough, dyspnea or wheezes Cardiovascular: Denies palpitation, chest discomfort or lower extremity swelling Gastrointestinal:  Denies nausea, heartburn or change in bowel habits Skin: Denies abnormal skin rashes Lymphatics: Denies new lymphadenopathy or easy bruising Neurological:Denies numbness, tingling or new weaknesses Behavioral/Psych: Mood is stable, no new changes  Breast: Denies any palpable lumps or discharge All other systems were reviewed with the patient and are negative.  PHYSICAL EXAMINATION: ECOG PERFORMANCE STATUS: 0 - Asymptomatic  Vitals:   05/06/24 1408  BP: (!) 111/49  Pulse: 69  Resp: (!) 22  Temp: 97.9 F (36.6 C)  SpO2: 99%    Filed Weights   05/06/24 1408  Weight: 252 lb 8 oz (114.5 kg)   General appearance: Alert, oriented and in no  acute distress Chest: Bilateral breast inspected and palpated.  No palpable masses or regional adenopathy  LABORATORY DATA:  I have reviewed the data as listed Lab Results  Component Value Date   WBC 9.3 04/20/2023   HGB 13.0 04/20/2023   HCT 40.3 04/20/2023   MCV 85.7 04/20/2023   PLT 325 04/20/2023   Lab Results  Component Value Date   NA 138 04/20/2023   K 3.6 04/20/2023   CL 102 04/20/2023   CO2 28 04/20/2023    RADIOGRAPHIC STUDIES: I have personally reviewed the radiological reports and agreed with the findings in the report.  Total time spent: 20 minutes including history, physical exam, review of records, counseling and coordination of care All questions were answered. The patient knows to call the clinic with any problems, questions or concerns.    Amber Stalls, MD 05/06/24

## 2024-06-05 ENCOUNTER — Ambulatory Visit: Payer: Self-pay | Admitting: *Deleted

## 2024-06-05 ENCOUNTER — Ambulatory Visit
Admission: RE | Admit: 2024-06-05 | Discharge: 2024-06-05 | Disposition: A | Discharge status: Home / Self Care | Payer: Self-pay | Source: Ambulatory Visit | Attending: Obstetrics and Gynecology | Admitting: Obstetrics and Gynecology

## 2024-06-05 VITALS — BP 156/64 | Wt 253.0 lb

## 2024-06-05 DIAGNOSIS — N6310 Unspecified lump in the right breast, unspecified quadrant: Secondary | ICD-10-CM

## 2024-06-05 DIAGNOSIS — N6099 Unspecified benign mammary dysplasia of unspecified breast: Secondary | ICD-10-CM

## 2024-06-05 DIAGNOSIS — Z1239 Encounter for other screening for malignant neoplasm of breast: Secondary | ICD-10-CM

## 2024-06-05 NOTE — Patient Instructions (Signed)
 Explained breast self awareness with Jill Conrad. Patient did not need a Pap smear today due to last Pap smear and HPV typing was 02/16/2022. Let her know BCCCP will cover Pap smears and HPV typing every 5 years unless has a history of abnormal Pap smears. Referred patient to the [facility] for a diagnostic mammogram per recommendation. Appointment scheduled Thursday, June 05, 2024 at 1040. Patient aware of appointment and will be there. Shulamit A Berma Harts verbalized understanding.  Nicanor Mendolia, Wanda Ship, RN 8:58 AM

## 2024-06-05 NOTE — Progress Notes (Signed)
 [name] is a 53 y.o. female who presents to [facility] clinic today with no complaints. Patient had a diagnostic mammogram completed 05/25/2023 that was probably benign that a bilateral diagnostic mammogram is recommended in one year for follow up.   Pap Smear: Pap smear not completed today. Last Pap smear was 02/16/2022 at [facility] clinic and was normal with negative HPV. Per patient has no history of an abnormal Pap smear. Last Pap smear result is available in Epic.   Physical exam: Breasts Right breast slightly larger than left breast that per patient has not noticed any changes. No skin abnormalities bilateral breasts. Right nipple slightly inverted that per patient is normal for her. No nipple inversion observed left breast. No nipple discharge bilateral breasts. No lymphadenopathy. No lumps palpated bilateral breasts. No complaints of pain or tenderness on exam.       MS 3D DIAG MAMMO BILAT BR (aka MM) Result Date: 06/05/2024 CLINICAL DATA:  Second follow-up for probably benign bilateral breast masses, initially assessed on 06/02/2022. Patient also had excision of left breast ALH on 10/06/2022. EXAM: DIGITAL DIAGNOSTIC BILATERAL MAMMOGRAM WITH TOMOSYNTHESIS AND CAD; ULTRASOUND RIGHT BREAST LIMITED; ULTRASOUND LEFT BREAST LIMITED TECHNIQUE: Bilateral digital diagnostic mammography and breast tomosynthesis was performed. The images were evaluated with computer-aided detection. ; Targeted ultrasound examination of the right breast was performed; Targeted ultrasound examination of the left breast was performed. COMPARISON:  Previous exam(s). ACR Breast Density Category b: There are scattered areas of fibroglandular density. FINDINGS: Subtle postsurgical changes on the left are stable from the most recent prior study. Stable circumscribed masses noted in the upper outer left breast in the upper right breast. There are no new masses, areas of significant asymmetry, areas of nonsurgical  architectural distortion or suspicious calcifications. Targeted right breast ultrasound is performed, showing a circumscribed oval hypoechoic mass at 12 o'clock, 6 cm the nipple, measuring 8 x 5 x 7 mm. At 12 o'clock, 3 cm the nipple, there is a mixed echogenicity mass measuring 6 x 4 x 5 mm, likely a cluster of cysts. Both are stable from the prior exams. Targeted left breast ultrasound is performed, showing a hypoechoic circumscribed oval mass at 2 o'clock, 8 cm the nipple, measuring 9 x 5 x 8 mm, stable from the prior exams. IMPRESSION: 1. No evidence of breast malignancy. 2. Benign bilateral breast masses stable for 2 years. RECOMMENDATION: 1.  Screening mammogram in one year.(Code:SM-B-01Y) I have discussed the findings and recommendations with the patient. If applicable, a reminder letter will be sent to the patient regarding the next appointment. BI-RADS CATEGORY  2: Benign. Electronically Signed   By: Alm Parkins M.D.   On: 06/05/2024 11:22   MM 3D DIAGNOSTIC MAMMOGRAM BILATERAL BREAST Result Date: 05/25/2023 CLINICAL DATA:  53 year old female presents for delayed six-month follow-up of bilateral breast masses (now 1 year). 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 EXAM: DIGITAL DIAGNOSTIC BILATERAL MAMMOGRAM WITH TOMOSYNTHESIS AND CAD; ULTRASOUND LEFT BREAST LIMITED; ULTRASOUND RIGHT BREAST LIMITED TECHNIQUE: Bilateral digital diagnostic mammography and breast tomosynthesis was performed. The images were evaluated with computer-aided detection. ; Targeted ultrasound examination of the left breast was performed.; Targeted ultrasound examination of the right breast was performed COMPARISON:  Previous exam(s). ACR Breast Density Category b: There are scattered areas of fibroglandular density. FINDINGS: Full field views of both breasts and a spot compression view of the RIGHT breast demonstrate unchanged oval masses within the central RIGHT breast and UPPER OUTER LEFT breast.  No suspicious mammographic findings within either breast noted. Surgical changes within the LEFT breast are noted. Targeted ultrasound is performed, showing the following: RIGHT breast: A 0.7 x 0.4 x 0.8 cm circumscribed oval hypoechoic mass at the 12 o'clock position 6 cm from the nipple, not significantly changed given technique. A 0.7 x 0.4 x 0.9 cm circumscribed oval hypoechoic mass at the 12 o'clock position 3 cm from the nipple. Unchanged duct ectasia at the 10 o'clock position 3 cm from the nipple. LEFT breast: A stable 0.9 x 0.5 x 0.7 cm circumscribed oval hypoechoic mass at the 2 o'clock position 8 cm from the nipple. The LEFT breast mass at the 2 o'clock position 4 cm from the nipple previously identified is no longer visualized and was likely removed surgically. IMPRESSION: 1. No significant change of 2 RIGHT breast masses and 1 LEFT breast mass. One year follow-up recommended to ensure 2 year stability. 2. No new suspicious mammographic findings within either breast. RECOMMENDATION: Bilateral diagnostic mammogram and bilateral breast ultrasound in 1 year. I have discussed the findings and recommendations with the patient. If applicable, a reminder letter will be sent to the patient regarding the next appointment. BI-RADS CATEGORY  3: Probably benign. Electronically Signed   By: Reyes Phi M.D.   On: 05/25/2023 09:29   MM Breast Surgical Specimen Result Date: 10/06/2022 CLINICAL DATA:  Biopsy-proven atypical lobular hyperplasia involving the UPPER OUTER QUADRANT of the LEFT breast. Radioactive seed localization was performed yesterday in anticipation of today's excisional biopsy. EXAM: SPECIMEN RADIOGRAPH OF THE LEFT BREAST COMPARISON:  Previous exam(s). FINDINGS: Status post excision of the LEFT breast. The radioactive seed in the coil shaped tissue marking clip are present within the non-compressed specimen. The seed is intact. This was discussed by telephone with the operating room nurse at the  time of interpretation on 10/06/2022 at 8:52 a.m. IMPRESSION: Specimen radiograph of the LEFT breast. Electronically Signed   By: Debby Satterfield M.D.   On: 10/06/2022 08:54  MM LT RADIOACTIVE SEED LOC MAMMO GUIDE Result Date: 10/05/2022 CLINICAL DATA:  53 year old female presenting for radioactive seed localization of the left breast prior to excisional biopsy. EXAM: MAMMOGRAPHIC GUIDED RADIOACTIVE SEED LOCALIZATION OF THE LEFT BREAST COMPARISON:  Previous exam(s). FINDINGS: Patient presents for radioactive seed localization prior to excisional biopsy of the left breast. I met with the patient and we discussed the procedure of seed localization including benefits and alternatives. We discussed the high likelihood of a successful procedure. We discussed the risks of the procedure including infection, bleeding, tissue injury and further surgery. We discussed the low dose of radioactivity involved in the procedure. Informed, written consent was given. The usual time-out protocol was performed immediately prior to the procedure. Using mammographic guidance, sterile technique, 1% lidocaine  and an I-125 radioactive seed, the coil shaped biopsy marking clip in the superior left breast was localized using a superior approach. The follow-up mammogram images confirm the seed in the expected location and were marked for Dr. Curvin. Follow-up survey of the patient confirms presence of the radioactive seed. Order number of I-125 seed:  [id number]. Total activity:  0.236 millicuries reference Date: 09/08/2022 The patient tolerated the procedure well and was released from the Breast Center. She was given instructions regarding seed removal. IMPRESSION: Radioactive seed localization left breast. No apparent complications. Electronically Signed   By: Rosaline Collet M.D.   On: 10/05/2022 13:27  MM LT BREAST BX W LOC DEV 1ST LESION IMAGE BX SPEC STEREO GUIDE Addendum Date: 07/02/2022  ADDENDUM REPORT: 07/02/2022 12:48  ADDENDUM: Pathology revealed FOCAL ATYPICAL LOBULAR HYPERPLASIA (ALH), FIBROCYSTIC CHANGES WITH CALCIFICATIONS of the LEFT breast, upper outer, (coil clip). This was found to be concordant by Dr. Alm Pouch with surgical consultation and High Risk Screening recommended. Pathology results were discussed with the patient by telephone by [facility] Representative # [phone]. The patient reported doing well after the biopsy with tenderness at the site. Post biopsy instructions and care were reviewed and questions were answered. The patient was encouraged to call The [facility] for any additional concerns. Follow up protocol for patients with lobular neoplasia being observed will have diagnostic mammograms for two years (6 month follow up, 6 month follow up, 12 month follow up), then returned to annual screening mammograms. The patient was asked to return for BILATERAL diagnostic mammography and ultrasound in 6 months and informed a reminder notice would be sent regarding this appointment. Surgical consultation has been arranged with Dr. Deward Null at [facility] on August 11, 2022. Pathology results reported by Hendricks Benders, RN on 06/27/2022. Electronically Signed   By: Alm Pouch III M.D.   On: 07/02/2022 12:48   Result Date: 07/02/2022 CLINICAL DATA:  Biopsy left breast calcifications EXAM: LEFT BREAST STEREOTACTIC CORE NEEDLE BIOPSY COMPARISON:  Previous exam(s). FINDINGS: The patient and I discussed the procedure of stereotactic-guided biopsy including benefits and alternatives. We discussed the high likelihood of a successful procedure. We discussed the risks of the procedure including infection, bleeding, tissue injury, clip migration, and inadequate sampling. Informed written consent was given. The usual time out protocol was performed immediately prior to the procedure. Using sterile technique and 1% Lidocaine  as local anesthetic, under stereotactic  guidance, a 9 gauge vacuum assisted device was used to perform core needle biopsy of calcifications in the upper-outer left breast using a superior approach. Specimen radiograph was performed showing calcifications in 2/3 specimen. Specimens with calcifications are identified for pathology. Lesion quadrant: Upper outer left breast At the conclusion of the procedure, a coil shaped tissue marker clip was deployed into the biopsy cavity. Follow-up 2-view mammogram was performed and dictated separately. IMPRESSION: Stereotactic-guided biopsy of left breast calcifications. No apparent complications. Electronically Signed: By: Alm Pouch III M.D. On: 06/23/2022 14:29  MM CLIP PLACEMENT LEFT Result Date: 06/23/2022 CLINICAL DATA:  Evaluate biopsy marker EXAM: 3D DIAGNOSTIC LEFT MAMMOGRAM POST STEREOTACTIC BIOPSY COMPARISON:  Previous exam(s). FINDINGS: 3D Mammographic images were obtained following stereotactic guided biopsy of left breast calcifications. The biopsy marking clip is in expected position at the site of biopsy. IMPRESSION: Appropriate positioning of the coil shaped biopsy marking clip at the site of biopsy in the location of the biopsied left breast calcifications. Final Assessment: Post Procedure Mammograms for Marker Placement Electronically Signed   By: Alm Pouch III M.D.   On: 06/23/2022 14:30  MM DIAG BREAST TOMO BILATERAL Result Date: 06/02/2022 CLINICAL DATA:  Patient recalled from screening for bilateral masses and left breast calcifications. EXAM: DIGITAL DIAGNOSTIC BILATERAL MAMMOGRAM WITH TOMOSYNTHESIS; ULTRASOUND RIGHT BREAST LIMITED; ULTRASOUND LEFT BREAST LIMITED TECHNIQUE: Bilateral digital diagnostic mammography and breast tomosynthesis was performed.; Targeted ultrasound examination of the right breast was performed; Targeted ultrasound examination of the left breast was performed. COMPARISON:  Previous exam(s). ACR Breast Density Category c: The breast tissue is  heterogeneously dense, which may obscure small masses. FINDINGS: Persistent oval mass within the central slightly superior right breast. Within the upper-outer left breast middle depth there is a persistent oval mass. Within the  upper-outer left breast posterior depth there is a persistent oval mass. Within the superior slightly lateral left breast middle depth there is an indeterminate 1.6 cm group of calcifications, further evaluated with magnification views. Targeted ultrasound is performed, showing a 10 x 9 x 7 mm oval hypoechoic mass left breast 2 o'clock position 4 cm from nipple. There is an 8 x 8 x 5 mm oval hypoechoic mass left breast 2 o'clock position 8 cm from nipple. No left axillary adenopathy. Dilated duct with internal debris right breast 10 o'clock position 3 cm from nipple. Mildly complicated cyst right breast 12 o'clock position 3 cm from nipple measuring 9 x 7 x 5 mm. Oval hypoechoic 10 x 7 x 4 mm mass right breast 12 o'clock position 6 cm from nipple. No right axillary adenopathy. IMPRESSION: 1. Indeterminate left breast calcifications. 2. Probably benign bilateral breast masses. RECOMMENDATION: 1. Stereotactic guided core needle biopsy left breast calcifications. 2. If this demonstrates benign pathology, recommend six-month follow-up bilateral diagnostic mammography and bilateral breast ultrasound to reassess the probably benign bilateral breast masses. I have discussed the findings and recommendations with the patient. If applicable, a reminder letter will be sent to the patient regarding the next appointment. BI-RADS CATEGORY  4: Suspicious. Electronically Signed   By: Bard Moats M.D.   On: 06/02/2022 14:37  MS DIGITAL SCREENING TOMO BILATERAL Result Date: 02/17/2022 CLINICAL DATA:  Screening. EXAM: DIGITAL SCREENING BILATERAL MAMMOGRAM WITH TOMOSYNTHESIS AND CAD TECHNIQUE: Bilateral screening digital craniocaudal and mediolateral oblique mammograms were obtained. Bilateral screening  digital breast tomosynthesis was performed. The images were evaluated with computer-aided detection. COMPARISON:  Previous exam(s). ACR Breast Density Category c: The breast tissue is heterogeneously dense, which may obscure small masses. FINDINGS: In the right breast a mass requires further evaluation. In the left breast 2 masses and a group of calcifications require further evaluation. IMPRESSION: Further evaluation is suggested for possible mass in the right breast. Further evaluation is suggested for possible masses in calcifications in the left breast. RECOMMENDATION: Diagnostic mammogram and possibly ultrasound of both breasts. (Code:FI-B-33M) The patient will be contacted regarding the findings, and additional imaging will be scheduled. BI-RADS CATEGORY  0: Incomplete. Need additional imaging evaluation and/or prior mammograms for comparison. Electronically Signed   By: Alm Pouch III M.D.   On: 02/17/2022 18:12   Pelvic/Bimanual Pap is not indicated today per BCCCP guidelines.   Smoking History: Patient has never smoked.  Patient Navigation: Patient education provided. Access to services provided for patient through [place] program. Spanish interpreter Bernice Angry from [facility] provided.   Colorectal Cancer Screening: Per patient has had colonoscopy completed on 11/24/2021 at [facility]. No complaints today.    Breast and Cervical Cancer Risk Assessment: Patient does not have family history of breast cancer. Patient has a personal history of Atypical Lobular Hyperplasia. Patient has no known genetic mutations or history of radiation treatment to the chest before age 75. Patient does not have history of cervical dysplasia, immunocompromised, or DES exposure in-utero.  Risk Scores as of Encounter on 06/05/2024     [place]isa           5-year 2.18%   Lifetime 17.1%   This patient is [place] but has no documented birth country, so the [place] model used data from [place]  patients to calculate their risk score. Document a birth country in the Demographics activity for a more accurate score.         Last calculated by Silas, Ansyi K, CMA  on 06/05/2024 at  8:54 AM        A: BCCCP exam without pap smear No complaints.  P: Referred patient to the [facility] for a diagnostic mammogram per recommendation. Appointment scheduled Thursday, June 05, 2024 at 1040.  Driscilla Wanda SQUIBB, RN 06/05/2024 8:58 AM

## 2025-05-06 ENCOUNTER — Ambulatory Visit: Payer: Self-pay | Admitting: Hematology and Oncology
